# Patient Record
Sex: Male | Born: 1955 | Race: White | Hispanic: No | Smoking: Current every day smoker
Health system: Southern US, Community
[De-identification: ages and names within clinical notes are randomized; demographics above are authoritative.]

## PROBLEM LIST (undated history)

## (undated) DIAGNOSIS — M2540 Effusion, unspecified joint: Secondary | ICD-10-CM

## (undated) DIAGNOSIS — J302 Other seasonal allergic rhinitis: Secondary | ICD-10-CM

## (undated) DIAGNOSIS — M2550 Pain in unspecified joint: Secondary | ICD-10-CM

## (undated) HISTORY — PX: FINGER SURGERY: SHX640

## (undated) HISTORY — PX: FOOT SURGERY: SHX648

## (undated) HISTORY — PX: KNEE ARTHROSCOPY: SUR90

## (undated) HISTORY — DX: Other seasonal allergic rhinitis: J30.2

## (undated) HISTORY — PX: OTHER SURGICAL HISTORY: SHX169

---
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
## 1977-02-28 HISTORY — PX: PLANTAR'S WART EXCISION: SHX2240

## 1999-01-09 ENCOUNTER — Encounter: Payer: Self-pay | Admitting: Emergency Medicine

## 1999-01-09 ENCOUNTER — Emergency Department (HOSPITAL_COMMUNITY): Admission: EM | Admit: 1999-01-09 | Discharge: 1999-01-09 | Payer: Self-pay | Admitting: Emergency Medicine

## 1999-01-24 ENCOUNTER — Emergency Department (HOSPITAL_COMMUNITY): Admission: EM | Admit: 1999-01-24 | Discharge: 1999-01-24 | Payer: Self-pay | Admitting: Emergency Medicine

## 2002-03-08 ENCOUNTER — Encounter: Payer: Self-pay | Admitting: Family Medicine

## 2002-03-08 ENCOUNTER — Encounter: Admission: RE | Admit: 2002-03-08 | Discharge: 2002-03-08 | Payer: Self-pay | Admitting: Family Medicine

## 2010-02-28 HISTORY — PX: HEEL SPUR SURGERY: SHX665

## 2011-01-10 DIAGNOSIS — F17200 Nicotine dependence, unspecified, uncomplicated: Secondary | ICD-10-CM | POA: Insufficient documentation

## 2011-01-10 DIAGNOSIS — Y9289 Other specified places as the place of occurrence of the external cause: Secondary | ICD-10-CM | POA: Insufficient documentation

## 2011-01-10 DIAGNOSIS — M79609 Pain in unspecified limb: Secondary | ICD-10-CM | POA: Insufficient documentation

## 2011-01-10 DIAGNOSIS — M25476 Effusion, unspecified foot: Secondary | ICD-10-CM | POA: Insufficient documentation

## 2011-01-10 DIAGNOSIS — Y990 Civilian activity done for income or pay: Secondary | ICD-10-CM | POA: Insufficient documentation

## 2011-01-10 DIAGNOSIS — M25473 Effusion, unspecified ankle: Secondary | ICD-10-CM | POA: Insufficient documentation

## 2011-01-10 DIAGNOSIS — S92009A Unspecified fracture of unspecified calcaneus, initial encounter for closed fracture: Secondary | ICD-10-CM | POA: Insufficient documentation

## 2011-01-10 DIAGNOSIS — R296 Repeated falls: Secondary | ICD-10-CM | POA: Insufficient documentation

## 2011-01-11 ENCOUNTER — Emergency Department (HOSPITAL_COMMUNITY): Payer: Worker's Compensation

## 2011-01-11 ENCOUNTER — Emergency Department (HOSPITAL_COMMUNITY)
Admission: EM | Admit: 2011-01-11 | Discharge: 2011-01-11 | Disposition: A | Discharge status: Home / Self Care | Payer: Worker's Compensation | Attending: Emergency Medicine | Admitting: Emergency Medicine

## 2011-01-11 ENCOUNTER — Encounter: Payer: Self-pay | Admitting: Emergency Medicine

## 2011-01-11 DIAGNOSIS — S92002A Unspecified fracture of left calcaneus, initial encounter for closed fracture: Secondary | ICD-10-CM

## 2011-01-11 MED ORDER — OXYCODONE-ACETAMINOPHEN 5-325 MG PO TABS: 2.0000 | ORAL_TABLET | ORAL | Status: AC | PRN

## 2011-01-11 MED ORDER — OXYCODONE-ACETAMINOPHEN 5-325 MG PO TABS
2.0000 | ORAL_TABLET | Freq: Once | ORAL | Status: AC
  Administered 2011-01-11: 2 via ORAL
  Filled 2011-01-11 (×2): qty 1

## 2011-01-11 NOTE — ED Provider Notes (Signed)
History     CSN: [id number] Arrival date & time: 01/11/2011 12:07 AM   First MD Initiated Contact with Patient 01/11/11 [phone]      Chief Complaint  Patient presents with  . Foot Pain    (Consider location/radiation/quality/duration/timing/severity/associated sxs/prior treatment) HPI 55 year old gentleman presents from work after jumping from machinery about 5 feet and landing on his left foot. Patient unable to bear weight on his left foot due to pain. Pain in the heel and posterior part of ankle. No prior history of injury to this area. No other complaint of pain or injury  History reviewed. No pertinent past medical history.  History reviewed. No pertinent past surgical history.  No family history on file.  History  Substance Use Topics  . Smoking status: Current Everyday Smoker  . Smokeless tobacco: Not on file  . Alcohol Use: Yes     OCCASIONAL      Review of Systems  Constitutional: Negative.   HENT: Negative.   Eyes: Negative.   Respiratory: Negative.   Cardiovascular: Negative.   Gastrointestinal: Negative.   Genitourinary: Negative.   Musculoskeletal: Positive for arthralgias.  Skin: Negative.   Neurological: Negative.   Hematological: Negative.   Psychiatric/Behavioral: Negative.   All other systems reviewed and are negative.    Allergies  Review of patient's allergies indicates no known allergies.  Home Medications   Current Outpatient Rx  Name Route Sig Dispense Refill  . IBUPROFEN 200 MG PO TABS Oral Take 400 mg by mouth every 6 (six) hours as needed. For pain     . OXYCODONE-ACETAMINOPHEN 5-325 MG PO TABS Oral Take 2 tablets by mouth every 4 (four) hours as needed for pain. 20 tablet 0    BP 125/86  Pulse 88  Temp(Src) 98.8 F (37.1 C) (Oral)  Resp 14  SpO2 98%  Physical Exam  Nursing note and vitals reviewed. Constitutional: He is oriented to person, place, and time. He appears well-developed and well-nourished.  HENT:  Head:  Normocephalic and atraumatic.  Right Ear: External ear normal.  Left Ear: External ear normal.  Nose: Nose normal.  Mouth/Throat: Oropharynx is clear and moist.  Eyes: Conjunctivae and EOM are normal. Pupils are equal, round, and reactive to light.  Neck: Normal range of motion. Neck supple. No JVD present. No tracheal deviation present. No thyromegaly present.  Cardiovascular: Normal rate, regular rhythm, normal heart sounds and intact distal pulses.  Exam reveals no gallop and no friction rub.   No murmur heard. Pulmonary/Chest: Effort normal and breath sounds normal. No stridor. No respiratory distress. He has no wheezes. He has no rales. He exhibits no tenderness.  Abdominal: Soft. Bowel sounds are normal. He exhibits no distension and no mass. There is no tenderness. There is no rebound and no guarding.  Musculoskeletal: He exhibits tenderness. He exhibits no edema.       Pain with palpation of left heel, swelling of left ankle without crepitus of the malleolus. No pain with palpation of her malleolus. Pain with palpation of the Achilles tendon and medial ligaments. Ecchymosis noted over posterior aspect of ankle  Lymphadenopathy:    He has no cervical adenopathy.  Neurological: He is oriented to person, place, and time. He has normal reflexes. No cranial nerve deficit. He exhibits normal muscle tone. Coordination normal.  Skin: Skin is dry. No rash noted. No erythema. No pallor.  Psychiatric: He has a normal mood and affect. His behavior is normal. Judgment and thought content normal.    ED  Course  Procedures (including critical care time)  Labs Reviewed - No data to display Dg Ankle Complete Left  01/11/2011  *RADIOLOGY REPORT*  Clinical Data: Jumped off machine from about 5 feet above ground; left heel and ankle pain.  LEFT ANKLE COMPLETE - 3+ VIEW  Comparison: None.  Findings: There is an abnormal pattern of sclerosis within the calcaneus, raising concern for a poorly characterized  comminuted calcaneal fracture.  The appearance is compatible with a joint depression type fracture.  The ankle mortise is intact; the interosseous space is within normal limits.  No talar tilt or subluxation is seen.  No significant soft tissue abnormalities are characterized on radiograph.  IMPRESSION: Poorly characterized comminuted joint depression type fracture of the calcaneus noted; no additional fractures seen.  Original Report Authenticated By: Tonia Ghent, M.D.   Ct Ankle Left Wo Contrast  01/11/2011  *RADIOLOGY REPORT*  Clinical Data: Left foot pain after jumping 5 feet from machine. Calcaneal fracture noted on radiograph.  Assess calcaneal fracture.  CT OF THE LEFT ANKLE WITHOUT CONTRAST  Technique:  Multidetector CT imaging was performed according to the standard protocol. Multiplanar CT image reconstructions were also generated.  Comparison: Left ankle radiographs performed earlier today at 12:26 a.m.  Findings: There is a comminuted fracture involving the left calcaneus.  This reflects a Sanders type IIC fracture, with a medial intra-articular fracture line across the posterior facet.  Additional comminution is noted, with extra-articular fracture lines noted along the lateral aspect of the calcaneus. The medial intra-articular fracture demonstrates mild comminution at the medial aspect of the calcaneus.  In addition, minimally displaced fracture lines extend anteriorly across the calcaneus; it is difficult to determine whether one of these fracture lines extends to the anterior facet.  This reflects a joint depression type fracture, with the fracture line seen extending posteriorly and superiorly between the talus and Achilles.  The posterior aspect of the calcaneus remains intact.  A small joint effusion is noted at the ankle.  No additional fractures are seen.  The Achilles tendon remains intact.  The flexor and extensor tendons are unremarkable in appearance.  The peroneus tendons appear  intact.  Mild soft tissue swelling is noted about the ankle, relatively diffuse in appearance.  There is no evidence of entrapment of tendon structures, though the peroneus tendons run adjacent to the comminuted calcaneal fracture.  The vasculature is not well assessed.  IMPRESSION:  1.  Comminuted fracture involving the left calcaneus; this is a joint depression type fracture.  With respect to the posterior facet, this is a Sanders type IIC fracture, with a medial intra- articular fracture line seen.  Question of minimally displaced fracture line extending to the anterior facet. 2.  Small ankle joint effusion noted. 3.  Mild diffuse soft tissue swelling noted. 4.  The peroneus tendons run adjacent to the comminuted calcaneal fracture; no definite evidence of entrapment at this time.  Original Report Authenticated By: Tonia Ghent, M.D.     1. Calcaneus fracture, left       MDM  55 year old male with comminuted left calcaneus fracture. Discuss with Dr. Ave Filter who requests putting him in a bulky Jones dressing/splint, no weightbearing, pain control and crutches, and followup in one week        Olivia Mackie, MD 01/11/11 [phone]

## 2011-01-11 NOTE — Progress Notes (Signed)
Orthopedic Tech Progress Note Patient Details:  Bryan [name] [birth year] [id number]  Other Ortho Devices Type of Ortho Device: Crutches Ortho Device Location: left leg watson Lasandra Beech 01/11/2011, 8:54 AM

## 2011-01-11 NOTE — ED Notes (Signed)
Patient states he was at work and jumped off a machine approx. 5 ft. Landing directly on the heel of his left foot. Left ankle swollen, postive pedal pulse.

## 2011-01-11 NOTE — ED Notes (Signed)
PT. REPORTS LEFT FOOT PAIN ONSET THIS EVENING , STATES HE JUMPED FROM A MACHINE THIS EVENING AT WORK [facility] [employer]) . NO LOC , PAIN WORSE WITH MOVEMENT/ SWELLING.

## 2011-01-11 NOTE — Progress Notes (Signed)
Orthopedic Tech Progress Note Patient Details:  Bryan [name] 1955/03/23 [id number]  Other Ortho Devices Ortho Device Location: left leg watson jones   [name] 01/11/2011, 8:51 AM

## 2011-02-01 ENCOUNTER — Encounter (HOSPITAL_COMMUNITY): Payer: Self-pay | Admitting: Pharmacy Technician

## 2011-02-04 ENCOUNTER — Encounter (HOSPITAL_COMMUNITY): Payer: Self-pay

## 2011-02-04 ENCOUNTER — Encounter (HOSPITAL_COMMUNITY)
Admission: RE | Admit: 2011-02-04 | Discharge: 2011-02-04 | Disposition: A | Discharge status: Home / Self Care | Payer: Worker's Compensation | Source: Ambulatory Visit | Attending: Orthopedic Surgery | Admitting: Orthopedic Surgery

## 2011-02-04 ENCOUNTER — Other Ambulatory Visit: Payer: Self-pay

## 2011-02-04 HISTORY — DX: Pain in unspecified joint: M25.50

## 2011-02-04 HISTORY — DX: Effusion, unspecified joint: M25.40

## 2011-02-04 LAB — ABO/RH: ABO/RH(D): O POS

## 2011-02-04 LAB — CBC
Hemoglobin: 14.7 g/dL (ref 13.0–17.0)
MCHC: 34.9 g/dL (ref 30.0–36.0)
Platelets: 154 10*3/uL (ref 150–400)
RDW: 12.8 % (ref 11.5–15.5)

## 2011-02-04 LAB — URINALYSIS, ROUTINE W REFLEX MICROSCOPIC
Bilirubin Urine: NEGATIVE
Ketones, ur: 15 mg/dL — AB
Leukocytes, UA: NEGATIVE
Nitrite: NEGATIVE
Specific Gravity, Urine: 1.024 (ref 1.005–1.030)
Urobilinogen, UA: 1 mg/dL (ref 0.0–1.0)
pH: 5 (ref 5.0–8.0)

## 2011-02-04 LAB — DIFFERENTIAL
Eosinophils Absolute: 0.1 10*3/uL (ref 0.0–0.7)
Eosinophils Relative: 1 % (ref 0–5)
Lymphocytes Relative: 34 % (ref 12–46)
Lymphs Abs: 1.7 10*3/uL (ref 0.7–4.0)
Monocytes Relative: 12 % (ref 3–12)

## 2011-02-04 LAB — COMPREHENSIVE METABOLIC PANEL
ALT: 16 U/L (ref 0–53)
AST: 23 U/L (ref 0–37)
Albumin: 3.6 g/dL (ref 3.5–5.2)
Alkaline Phosphatase: 57 U/L (ref 39–117)
Calcium: 9.1 mg/dL (ref 8.4–10.5)
Potassium: 4.1 mEq/L (ref 3.5–5.1)
Sodium: 138 mEq/L (ref 135–145)
Total Protein: 6.7 g/dL (ref 6.0–8.3)

## 2011-02-04 LAB — APTT: aPTT: 38 seconds — ABNORMAL HIGH (ref 24–37)

## 2011-02-04 LAB — TYPE AND SCREEN: Antibody Screen: NEGATIVE

## 2011-02-04 NOTE — Pre-Procedure Instructions (Signed)
Bryan [name]  02/04/2011   Your procedure is scheduled on:  Tues,Dec 11 @ 1000  Report to [facility] at 0800 AM.  Call this number if you have problems the morning of surgery: [phone]   Remember:   Do not eat food:After Midnight.  May have clear liquids: up to 4 Hours before arrival.(until 4am)  Clear liquids include soda, tea, black coffee, apple or grape juice, broth.  Take these medicines the morning of surgery with A SIP OF WATER:    Do not wear jewelry, make-up or nail polish.  Do not wear lotions, powders, or perfumes. You may wear deodorant.  Do not shave 48 hours prior to surgery.  Do not bring valuables to the hospital.  Contacts, dentures or bridgework may not be worn into surgery.  Leave suitcase in the car. After surgery it may be brought to your room.  For patients admitted to the hospital, checkout time is 11:00 AM the day of discharge.   Patients discharged the day of surgery will not be allowed to drive home.  Name and phone number of your driver:   Special Instructions: CHG Shower Use Special Wash: 1/2 bottle night before surgery and 1/2 bottle morning of surgery.   Please read over the following fact sheets that you were given: Pain Booklet, Coughing and Deep Breathing, MRSA Information and Surgical Site Infection Prevention

## 2011-02-06 LAB — URINE CULTURE: Culture  Setup Time: 201212071134

## 2011-02-07 MED ORDER — CEFAZOLIN SODIUM 1-5 GM-% IV SOLN
1.0000 g | INTRAVENOUS | Status: AC
  Administered 2011-02-08: 1 g via INTRAVENOUS
  Filled 2011-02-07: qty 50

## 2011-02-07 NOTE — H&P (Signed)
Orthopaedic Trauma Service   Chief Complaint: L calcaneus Fracture HPI:  55 y/o caucasian male who fell at work on 01/10/2011 sustaining a complex L intra-articular calcaneus fracture.  Secondary to swelling pt could not be operated on acutely. We have been following the pt closely, watching his soft tissue carefully.  Pt has also smoked for many years and we discussed many options with him, stating that should he elect to have surgery he needed to quit smoking secondary to the surgical approach being a watershed area for blood flow.  The pt has done very well this far decreasing his nicotine intake and is now ready for ORIF of his L calcaneus fracture  Past Medical History  Diagnosis Date  . Joint pain     left heel  . Joint swelling     calcaneus fracture  . Curvature of spine     Past Surgical History  Procedure Date  . Foot surgery 30+yrs ago    left   . Finger surgery 30+yrs ago    right pointer finger  . Knee arthroscopy at age 66    right     Family History  Problem Relation Age of Onset  . Anesthesia problems Neg Hx   . Hypotension Neg Hx   . Malignant hyperthermia Neg Hx   . Pseudochol deficiency Neg Hx    Social History:  reports that he has been smoking Cigarettes.  He has a 35 pack-year smoking history. He does not have any smokeless tobacco history on file. He reports that he drinks alcohol. He reports that he does not use illicit drugs. Per pt report he has since decreases smoking intake dramatically  Allergies: No Known Allergies  No current facility-administered medications on file as of .   Medications Prior to Admission  Medication Sig Dispense Refill  . ibuprofen (ADVIL,MOTRIN) 200 MG tablet Take 400 mg by mouth every 6 (six) hours as needed. For pain        No results found for this or any previous visit (from the past 48 hour(s)). No results found.  ROS: no cp, sob, n/v/d  No recent illnesses   PE  Office vitals are stable  Gen:  NAD Lungs:  Decreased at bases o/w clear Cardiac: s1 and s2 Abd: + BS L LEX  Decreased swelling  Motor and sensory intact  Extremity is warm  + DP pulse     Assessment/Plan  55 y/o male s/p fall with L calcaneus fx  OR for ORIF L calcaneus Admit after surgery for pain control and observation Limit nicotine intake Pt understands full well the ramifications of continued nicotine intake including but not limited to infection, potentially leading to amputation, nonunion, further surgery, etc. Pt wants to proceed   Mearl Latin, PA-C 02/07/2011, 2:32 PM

## 2011-02-08 ENCOUNTER — Encounter (HOSPITAL_COMMUNITY): Payer: Self-pay | Admitting: *Deleted

## 2011-02-08 ENCOUNTER — Inpatient Hospital Stay (HOSPITAL_COMMUNITY): Payer: Worker's Compensation | Admitting: Anesthesiology

## 2011-02-08 ENCOUNTER — Inpatient Hospital Stay (HOSPITAL_COMMUNITY): Payer: Worker's Compensation

## 2011-02-08 ENCOUNTER — Encounter (HOSPITAL_COMMUNITY): Payer: Self-pay | Admitting: Anesthesiology

## 2011-02-08 ENCOUNTER — Ambulatory Visit (HOSPITAL_COMMUNITY)
Admission: RE | Admit: 2011-02-08 | Discharge: 2011-02-10 | Disposition: A | Discharge status: Home / Self Care | Payer: Worker's Compensation | Source: Ambulatory Visit | Attending: Orthopedic Surgery | Admitting: Orthopedic Surgery

## 2011-02-08 ENCOUNTER — Encounter (HOSPITAL_COMMUNITY)
Admission: RE | Disposition: A | Discharge status: Home / Self Care | Payer: Self-pay | Source: Ambulatory Visit | Attending: Orthopedic Surgery

## 2011-02-08 DIAGNOSIS — Z01810 Encounter for preprocedural cardiovascular examination: Secondary | ICD-10-CM | POA: Insufficient documentation

## 2011-02-08 DIAGNOSIS — X58XXXA Exposure to other specified factors, initial encounter: Secondary | ICD-10-CM | POA: Insufficient documentation

## 2011-02-08 DIAGNOSIS — S92002A Unspecified fracture of left calcaneus, initial encounter for closed fracture: Secondary | ICD-10-CM

## 2011-02-08 DIAGNOSIS — F17200 Nicotine dependence, unspecified, uncomplicated: Secondary | ICD-10-CM

## 2011-02-08 DIAGNOSIS — S92009A Unspecified fracture of unspecified calcaneus, initial encounter for closed fracture: Principal | ICD-10-CM | POA: Insufficient documentation

## 2011-02-08 DIAGNOSIS — Z01818 Encounter for other preprocedural examination: Secondary | ICD-10-CM | POA: Insufficient documentation

## 2011-02-08 DIAGNOSIS — F1020 Alcohol dependence, uncomplicated: Secondary | ICD-10-CM

## 2011-02-08 DIAGNOSIS — Z01812 Encounter for preprocedural laboratory examination: Secondary | ICD-10-CM | POA: Insufficient documentation

## 2011-02-08 HISTORY — PX: ORIF CALCANEOUS FRACTURE: SHX5030

## 2011-02-08 SURGERY — OPEN REDUCTION INTERNAL FIXATION (ORIF) CALCANEOUS FRACTURE
Anesthesia: General | Site: Ankle | Laterality: Left | Wound class: Clean

## 2011-02-08 MED ORDER — MAGNESIUM HYDROXIDE 400 MG/5ML PO SUSP: 30.0000 mL | Freq: Every day | ORAL | Status: DC | PRN

## 2011-02-08 MED ORDER — ONDANSETRON HCL 4 MG/2ML IJ SOLN: 4.0000 mg | Freq: Four times a day (QID) | INTRAMUSCULAR | Status: DC | PRN

## 2011-02-08 MED ORDER — METHOCARBAMOL 100 MG/ML IJ SOLN: 500.0000 mg | Freq: Four times a day (QID) | INTRAVENOUS | Status: DC | PRN

## 2011-02-08 MED ORDER — METOCLOPRAMIDE HCL 10 MG PO TABS: 5.0000 mg | ORAL_TABLET | Freq: Three times a day (TID) | ORAL | Status: DC | PRN

## 2011-02-08 MED ORDER — POTASSIUM CHLORIDE IN NACL 20-0.9 MEQ/L-% IV SOLN
INTRAVENOUS | Status: DC
  Filled 2011-02-08 (×4): qty 1000

## 2011-02-08 MED ORDER — PREGABALIN 75 MG PO CAPS
75.0000 mg | ORAL_CAPSULE | Freq: Two times a day (BID) | ORAL | Status: DC
  Administered 2011-02-08 – 2011-02-10 (×4): 75 mg via ORAL
  Filled 2011-02-08 (×4): qty 1

## 2011-02-08 MED ORDER — SODIUM CHLORIDE 0.9 % IR SOLN
Status: DC | PRN
  Administered 2011-02-08: 1000 mL

## 2011-02-08 MED ORDER — GLYCOPYRROLATE 0.2 MG/ML IJ SOLN
INTRAMUSCULAR | Status: DC | PRN
  Administered 2011-02-08: .4 mg via INTRAVENOUS

## 2011-02-08 MED ORDER — DIPHENHYDRAMINE HCL 12.5 MG/5ML PO ELIX
12.5000 mg | ORAL_SOLUTION | Freq: Four times a day (QID) | ORAL | Status: DC | PRN
  Filled 2011-02-08: qty 5

## 2011-02-08 MED ORDER — HYDROMORPHONE HCL PF 1 MG/ML IJ SOLN: 0.2500 mg | INTRAMUSCULAR | Status: DC | PRN

## 2011-02-08 MED ORDER — SPIRITUS FRUMENTI
1.0000 | Freq: Every day | ORAL | Status: DC
  Administered 2011-02-08: 1 via ORAL
  Filled 2011-02-08 (×4): qty 1

## 2011-02-08 MED ORDER — DOCUSATE SODIUM 100 MG PO CAPS
100.0000 mg | ORAL_CAPSULE | Freq: Two times a day (BID) | ORAL | Status: DC
  Administered 2011-02-08 – 2011-02-10 (×4): 100 mg via ORAL
  Filled 2011-02-08 (×5): qty 1

## 2011-02-08 MED ORDER — METOCLOPRAMIDE HCL 5 MG/ML IJ SOLN
5.0000 mg | Freq: Three times a day (TID) | INTRAMUSCULAR | Status: DC | PRN
  Administered 2011-02-08: 10 mg via INTRAVENOUS
  Filled 2011-02-08: qty 2

## 2011-02-08 MED ORDER — MAGNESIUM CITRATE PO SOLN: 1.0000 | Freq: Once | ORAL | Status: AC | PRN

## 2011-02-08 MED ORDER — METHOCARBAMOL 500 MG PO TABS: 500.0000 mg | ORAL_TABLET | Freq: Four times a day (QID) | ORAL | Status: DC | PRN

## 2011-02-08 MED ORDER — OXYCODONE-ACETAMINOPHEN 5-325 MG PO TABS
1.0000 | ORAL_TABLET | Freq: Four times a day (QID) | ORAL | Status: DC | PRN
  Administered 2011-02-09 – 2011-02-10 (×4): 2 via ORAL
  Filled 2011-02-08 (×3): qty 2
  Filled 2011-02-08: qty 216

## 2011-02-08 MED ORDER — SODIUM CHLORIDE 0.9 % IJ SOLN: 9.0000 mL | INTRAMUSCULAR | Status: DC | PRN

## 2011-02-08 MED ORDER — PROPOFOL 10 MG/ML IV EMUL: INTRAVENOUS | Status: DC | PRN

## 2011-02-08 MED ORDER — FENTANYL CITRATE 0.05 MG/ML IJ SOLN
INTRAMUSCULAR | Status: AC
  Filled 2011-02-08: qty 2

## 2011-02-08 MED ORDER — NALOXONE HCL 0.4 MG/ML IJ SOLN: 0.4000 mg | INTRAMUSCULAR | Status: DC | PRN

## 2011-02-08 MED ORDER — MORPHINE SULFATE (PF) 1 MG/ML IV SOLN
INTRAVENOUS | Status: DC
  Administered 2011-02-08: 1.5 mg via INTRAVENOUS
  Filled 2011-02-08: qty 25

## 2011-02-08 MED ORDER — TEMAZEPAM 15 MG PO CAPS: 15.0000 mg | ORAL_CAPSULE | Freq: Every evening | ORAL | Status: DC | PRN

## 2011-02-08 MED ORDER — DIPHENHYDRAMINE HCL 12.5 MG/5ML PO ELIX
12.5000 mg | ORAL_SOLUTION | ORAL | Status: DC | PRN
  Filled 2011-02-08: qty 10

## 2011-02-08 MED ORDER — MORPHINE SULFATE 2 MG/ML IJ SOLN: 1.0000 mg | INTRAMUSCULAR | Status: DC | PRN

## 2011-02-08 MED ORDER — PROPOFOL 10 MG/ML IV BOLUS
INTRAVENOUS | Status: DC | PRN
  Administered 2011-02-08: 150 mg via INTRAVENOUS

## 2011-02-08 MED ORDER — DIPHENHYDRAMINE HCL 50 MG/ML IJ SOLN: 12.5000 mg | Freq: Four times a day (QID) | INTRAMUSCULAR | Status: DC | PRN

## 2011-02-08 MED ORDER — MIDAZOLAM HCL 2 MG/2ML IJ SOLN
INTRAMUSCULAR | Status: AC
  Filled 2011-02-08: qty 2

## 2011-02-08 MED ORDER — CEFAZOLIN SODIUM 1-5 GM-% IV SOLN
1.0000 g | Freq: Four times a day (QID) | INTRAVENOUS | Status: AC
  Administered 2011-02-08 – 2011-02-09 (×3): 1 g via INTRAVENOUS
  Filled 2011-02-08 (×3): qty 50

## 2011-02-08 MED ORDER — LACTATED RINGERS IV SOLN
INTRAVENOUS | Status: DC
  Administered 2011-02-08 (×2): via INTRAVENOUS

## 2011-02-08 MED ORDER — OXYCODONE HCL 5 MG PO TABS: 5.0000 mg | ORAL_TABLET | ORAL | Status: DC | PRN

## 2011-02-08 MED ORDER — CHLORHEXIDINE GLUCONATE 4 % EX LIQD: 60.0000 mL | Freq: Once | CUTANEOUS | Status: DC

## 2011-02-08 MED ORDER — ROCURONIUM BROMIDE 100 MG/10ML IV SOLN
INTRAVENOUS | Status: DC | PRN
  Administered 2011-02-08: 50 mg via INTRAVENOUS

## 2011-02-08 MED ORDER — PHENYLEPHRINE HCL 10 MG/ML IJ SOLN
INTRAMUSCULAR | Status: DC | PRN
  Administered 2011-02-08: 40 ug via INTRAVENOUS
  Administered 2011-02-08 (×3): 80 ug via INTRAVENOUS
  Administered 2011-02-08 (×3): 40 ug via INTRAVENOUS

## 2011-02-08 MED ORDER — BUPIVACAINE-EPINEPHRINE PF 0.5-1:200000 % IJ SOLN
INTRAMUSCULAR | Status: DC | PRN
  Administered 2011-02-08: 30 mL
  Administered 2011-02-08: 7 mL

## 2011-02-08 MED ORDER — PROMETHAZINE HCL 25 MG/ML IJ SOLN: 6.2500 mg | INTRAMUSCULAR | Status: DC | PRN

## 2011-02-08 MED ORDER — ENOXAPARIN SODIUM 40 MG/0.4ML ~~LOC~~ SOLN
40.0000 mg | SUBCUTANEOUS | Status: DC
  Administered 2011-02-09 – 2011-02-10 (×2): 40 mg via SUBCUTANEOUS
  Filled 2011-02-08 (×3): qty 0.4

## 2011-02-08 MED ORDER — MIDAZOLAM HCL 2 MG/2ML IJ SOLN
1.0000 mg | INTRAMUSCULAR | Status: AC | PRN
  Administered 2011-02-08 (×2): 2 mg via INTRAVENOUS

## 2011-02-08 MED ORDER — BISACODYL 10 MG RE SUPP: 10.0000 mg | Freq: Every day | RECTAL | Status: DC | PRN

## 2011-02-08 MED ORDER — FENTANYL CITRATE 0.05 MG/ML IJ SOLN
50.0000 ug | INTRAMUSCULAR | Status: AC | PRN
  Administered 2011-02-08: 250 ug via INTRAVENOUS
  Administered 2011-02-08: 100 ug via INTRAVENOUS

## 2011-02-08 MED ORDER — ONDANSETRON HCL 4 MG PO TABS: 4.0000 mg | ORAL_TABLET | Freq: Four times a day (QID) | ORAL | Status: DC | PRN

## 2011-02-08 MED ORDER — NEOSTIGMINE METHYLSULFATE 1 MG/ML IJ SOLN
INTRAMUSCULAR | Status: DC | PRN
  Administered 2011-02-08: 3 mg via INTRAVENOUS

## 2011-02-08 MED ORDER — ONDANSETRON HCL 4 MG/2ML IJ SOLN
INTRAMUSCULAR | Status: DC | PRN
  Administered 2011-02-08: 4 mg via INTRAVENOUS

## 2011-02-08 SURGICAL SUPPLY — 67 items
BANDAGE ACE 4 STERILE (GAUZE/BANDAGES/DRESSINGS) ×2 IMPLANT
BANDAGE ELASTIC 4 VELCRO ST LF (GAUZE/BANDAGES/DRESSINGS) ×2 IMPLANT
BANDAGE ELASTIC 6 VELCRO ST LF (GAUZE/BANDAGES/DRESSINGS) ×2 IMPLANT
BANDAGE ESMARK 6X9 LF (GAUZE/BANDAGES/DRESSINGS) ×1 IMPLANT
BANDAGE GAUZE ELAST BULKY 4 IN (GAUZE/BANDAGES/DRESSINGS) ×4 IMPLANT
BIT DRILL 2.5X2.75 QC CALB (BIT) ×2 IMPLANT
BLADE SURG 10 STRL SS (BLADE) ×2 IMPLANT
BNDG COHESIVE 4X5 TAN STRL (GAUZE/BANDAGES/DRESSINGS) ×2 IMPLANT
BNDG ESMARK 6X9 LF (GAUZE/BANDAGES/DRESSINGS) ×2
BONE CHIP PRESERV 20CC (Bone Implant) ×2 IMPLANT
BRUSH SCRUB DISP (MISCELLANEOUS) ×4 IMPLANT
CLOTH BEACON ORANGE TIMEOUT ST (SAFETY) ×2 IMPLANT
COVER MAYO STAND STRL (DRAPES) ×2 IMPLANT
COVER SURGICAL LIGHT HANDLE (MISCELLANEOUS) ×4 IMPLANT
DRAIN TLS ROUND 10FR (DRAIN) ×2 IMPLANT
DRAPE C-ARM 42X72 X-RAY (DRAPES) ×2 IMPLANT
DRAPE C-ARMOR (DRAPES) ×2 IMPLANT
DRAPE ORTHO SPLIT 77X108 STRL (DRAPES) ×1
DRAPE SURG ORHT 6 SPLT 77X108 (DRAPES) ×1 IMPLANT
DRAPE U-SHAPE 47X51 STRL (DRAPES) ×2 IMPLANT
DRSG EMULSION OIL 3X3 NADH (GAUZE/BANDAGES/DRESSINGS) ×2 IMPLANT
ELECT REM PT RETURN 9FT ADLT (ELECTROSURGICAL) ×2
ELECTRODE REM PT RTRN 9FT ADLT (ELECTROSURGICAL) ×1 IMPLANT
GAUZE SPONGE 4X4 12PLY STRL LF (GAUZE/BANDAGES/DRESSINGS) ×2 IMPLANT
GLOVE BIO SURGEON STRL SZ7.5 (GLOVE) ×2 IMPLANT
GLOVE BIO SURGEON STRL SZ8 (GLOVE) ×2 IMPLANT
GLOVE BIOGEL PI IND STRL 7.5 (GLOVE) ×1 IMPLANT
GLOVE BIOGEL PI INDICATOR 7.5 (GLOVE) ×1
GOWN PREVENTION PLUS XLARGE (GOWN DISPOSABLE) ×2 IMPLANT
GOWN STRL NON-REIN LRG LVL3 (GOWN DISPOSABLE) ×4 IMPLANT
K-WIRE ACE 1.6X6 (WIRE) ×6
KIT BASIN OR (CUSTOM PROCEDURE TRAY) ×2 IMPLANT
KIT ROOM TURNOVER OR (KITS) ×2 IMPLANT
KWIRE ACE 1.6X6 (WIRE) ×3 IMPLANT
MANIFOLD NEPTUNE II (INSTRUMENTS) ×2 IMPLANT
NEEDLE HYPO 21X1.5 SAFETY (NEEDLE) IMPLANT
NS IRRIG 1000ML POUR BTL (IV SOLUTION) ×2 IMPLANT
PACK ORTHO EXTREMITY (CUSTOM PROCEDURE TRAY) ×2 IMPLANT
PAD ARMBOARD 7.5X6 YLW CONV (MISCELLANEOUS) ×4 IMPLANT
PAD CAST 4YDX4 CTTN HI CHSV (CAST SUPPLIES) ×2 IMPLANT
PADDING CAST COTTON 4X4 STRL (CAST SUPPLIES) ×2
PADDING CAST COTTON 6X4 STRL (CAST SUPPLIES) ×2 IMPLANT
PADDING WEBRIL 4 STERILE (GAUZE/BANDAGES/DRESSINGS) ×2 IMPLANT
PADDING WEBRIL 6 STERILE (GAUZE/BANDAGES/DRESSINGS) ×2 IMPLANT
PENCIL BUTTON HOLSTER BLD 10FT (ELECTRODE) ×2 IMPLANT
PLATE ACE PERIMETER LRG (Plate) ×2 IMPLANT
SCREW CORTICAL 3.5MM  34MM (Screw) ×4 IMPLANT
SCREW CORTICAL 3.5MM 26MM (Screw) ×2 IMPLANT
SCREW CORTICAL 3.5MM 34MM (Screw) ×4 IMPLANT
SCREW CORTICAL 3.5MM 36MM (Screw) ×2 IMPLANT
SCREW CORTICAL 3.5MM 40MM (Screw) ×2 IMPLANT
SPLINT PLASTER CAST XFAST 5X30 (CAST SUPPLIES) ×1 IMPLANT
SPLINT PLASTER XFAST SET 5X30 (CAST SUPPLIES) ×1
SPONGE GAUZE 4X4 12PLY (GAUZE/BANDAGES/DRESSINGS) ×2 IMPLANT
SPONGE LAP 18X18 X RAY DECT (DISPOSABLE) ×6 IMPLANT
SPONGE SCRUB IODOPHOR (GAUZE/BANDAGES/DRESSINGS) ×2 IMPLANT
SUCTION FRAZIER TIP 10 FR DISP (SUCTIONS) ×2 IMPLANT
SUT ETHILON 3 0 PS 1 (SUTURE) ×4 IMPLANT
SUT VIC AB 2-0 CT3 27 (SUTURE) IMPLANT
SUT VIC AB 2-0 SH 18 (SUTURE) ×2 IMPLANT
SUT VIC AB 3-0 FS2 27 (SUTURE) ×2 IMPLANT
SYR CONTROL 10ML LL (SYRINGE) IMPLANT
TOWEL OR 17X24 6PK STRL BLUE (TOWEL DISPOSABLE) ×2 IMPLANT
TOWEL OR 17X26 10 PK STRL BLUE (TOWEL DISPOSABLE) ×4 IMPLANT
TUBE CONNECTING 12X1/4 (SUCTIONS) ×2 IMPLANT
UNDERPAD 30X30 INCONTINENT (UNDERPADS AND DIAPERS) ×2 IMPLANT
WATER STERILE IRR 1000ML POUR (IV SOLUTION) ×2 IMPLANT

## 2011-02-08 NOTE — Brief Op Note (Signed)
02/08/2011  2:52 PM  PATIENT:  Bryan [name]  55 y.o. male  PRE-OPERATIVE DIAGNOSIS:  FRACTURED LEFT CALCANEUS  POST-OPERATIVE DIAGNOSIS:  FRACTURED LEFT CALCANEUS  PROCEDURE:  Procedure(s): OPEN REDUCTION INTERNAL FIXATION (ORIF) CALCANEOUS FRACTURE  SURGEON:  Surgeon(s): [employer]  PHYSICIAN ASSISTANT: Montez Morita, PAC  ANESTHESIA:   regional, general  EBL:  Total I/O In: 1700 [I.V.:1700] Out: 50 [Blood:50]  BLOOD ADMINISTERED:none  DRAINS: (one) Jackson-Pratt drain(s) with closed bulb suction in the foot   LOCAL MEDICATIONS USED:  NONE  SPECIMEN:  No Specimen  DISPOSITION OF SPECIMEN:  N/A  COUNTS:  YES  TOURNIQUET:   Total Tourniquet Time Documented: Thigh (Left) - 71 minutes  DICTATION: .Other Dictation: Dictation Number [id number]  PLAN OF CARE: Admit to inpatient   PATIENT DISPOSITION:  PACU - hemodynamically stable.   Delay start of Pharmacological VTE agent (>24hrs) due to surgical blood loss or risk of bleeding:  NO

## 2011-02-08 NOTE — Transfer of Care (Signed)
Immediate Anesthesia Transfer of Care Note  Patient: Bryan [name]  Procedure(s) Performed:  OPEN REDUCTION INTERNAL FIXATION (ORIF) CALCANEOUS FRACTURE - Left Ankle.  Patient Location: PACU  Anesthesia Type: GA combined with regional for post-op pain  Level of Consciousness: awake, alert , oriented and patient cooperative  Airway & Oxygen Therapy: Patient Spontanous Breathing and Patient connected to nasal cannula oxygen  Post-op Assessment: Report given to PACU RN, Post -op Vital signs reviewed and stable and Patient moving all extremities X 4  Post vital signs: Reviewed and stable  Complications: No apparent anesthesia complications

## 2011-02-08 NOTE — Anesthesia Postprocedure Evaluation (Signed)
  Anesthesia Post-op Note  Patient: Bryan [name]  Procedure(s) Performed:  OPEN REDUCTION INTERNAL FIXATION (ORIF) CALCANEOUS FRACTURE - Left Ankle.  Patient Location: PACU  Anesthesia Type: GA combined with regional for post-op pain  Level of Consciousness: awake, alert  and oriented  Airway and Oxygen Therapy: Patient Spontanous Breathing  Post-op Pain: none  Post-op Assessment: Post-op Vital signs reviewed, Patient's Cardiovascular Status Stable, Respiratory Function Stable, Patent Airway, No signs of Nausea or vomiting and Pain level controlled  Post-op Vital Signs: Reviewed and stable  Complications: No apparent anesthesia complications

## 2011-02-08 NOTE — Anesthesia Preprocedure Evaluation (Addendum)
Anesthesia Evaluation  Patient identified by MRN, date of birth, ID band Patient awake    Reviewed: Allergy & Precautions, H&P , NPO status , Patient's Chart, lab work & pertinent test results  Airway Mallampati: II TM Distance: >3 FB Neck ROM: Full    Dental No notable dental hx. (+) Teeth Intact   Pulmonary Current Smoker,  clear to auscultation  Pulmonary exam normal       Cardiovascular neg cardio ROS Regular Normal    Neuro/Psych    GI/Hepatic negative GI ROS, Neg liver ROS,   Endo/Other  Negative Endocrine ROS  Renal/GU negative Renal ROS     Musculoskeletal negative musculoskeletal ROS (+)   Abdominal   Peds  Hematology   Anesthesia Other Findings   Reproductive/Obstetrics                           Anesthesia Physical Anesthesia Plan  ASA: II  Anesthesia Plan: General   Post-op Pain Management:    Induction: Intravenous  Airway Management Planned: Oral ETT  Additional Equipment:   Intra-op Plan:   Post-operative Plan: Extubation in OR  Informed Consent: I have reviewed the patients History and Physical, chart, labs and discussed the procedure including the risks, benefits and alternatives for the proposed anesthesia with the patient or authorized representative who has indicated his/her understanding and acceptance.   Dental advisory given  Plan Discussed with: CRNA and Surgeon  Anesthesia Plan Comments: (Plan routine monitors, GETA with popliteal block for post op analgesia)        Anesthesia Quick Evaluation

## 2011-02-08 NOTE — Anesthesia Procedure Notes (Addendum)
Anesthesia Regional Block:  Popliteal block  Pre-Anesthetic Checklist: ,, timeout performed, Correct Patient, Correct Site, Correct Laterality, Correct Procedure, Correct Position, site marked, Risks and benefits discussed,  Surgical consent,  Pre-op evaluation,  At surgeon's request and post-op pain management  Laterality: Left  Prep: chloraprep       Needles:  Injection technique: Single-shot  Needle Type: Stimulator Needle - 80      Needle Gauge: 22 and 22 G    Additional Needles:  Procedures: nerve stimulator Popliteal block  Nerve Stimulator or Paresthesia:  Response: toe dorsiflexion, toe abduction, 0.5 mA, 1 ms,   Additional Responses:   Narrative:  Start time: 02/08/2011 10:44 AM End time: 02/08/2011 10:59 AM Injection made incrementally with aspirations every 5 mL.  Performed by: Personally  Anesthesiologist: Sandford Craze, MD  Additional Notes: Pt identified in Holding room.  Monitors applied. Working IV access confirmed. Sterile prep.  #22ga PNS to toe dorsiflexion and toe abduction twitcesh at 0.55mA threshold.  30cc 0.5% Bupivacaine with 1:200k epi injected incrementally after negative test dose.  Patient asymptomatic, VSS, no heme aspirated, tolerated well.   Reprep L prox, ant, medial tibia.  7cc 0.5% Bupiv with epi 1:200k injected subcutaneously for saphenous nerve supplementation.  Pt tol well.  VSS   Procedure Name: Intubation Performed by: Isidore Moos A Preoxygenation: Pre-oxygenation with 100% oxygen Intubation Type: IV induction Ventilation: Mask ventilation without difficulty Laryngoscope Size: Miller and 3 Grade View: Grade II Tube type: Oral Tube size: 7.5 mm Placement Confirmation: ETT inserted through vocal cords under direct vision,  positive ETCO2 and breath sounds checked- equal and bilateral Secured at: 23 cm Tube secured with: Tape Dental Injury: Teeth and Oropharynx as per pre-operative assessment

## 2011-02-08 NOTE — H&P (Signed)
I have seen and examined the patient. I agree with the findings above. I discussed with the patient the risks and benefits of surgery, including the possibility of infection, nerve injury, vessel injury, wound breakdown, arthritis, symptomatic hardware, DVT/ PE, loss of motion, and need for further surgery among others.  We also specifically discussed the elevated risk of soft tissue breakdown that could lead to amputation.  He understands these risks and wishes to proceed.  Bryan [name] 02/08/2011 11:01 AM

## 2011-02-08 NOTE — Preoperative (Signed)
Beta Blockers   Reason not to administer Beta Blockers:Not Applicable 

## 2011-02-09 ENCOUNTER — Encounter (HOSPITAL_COMMUNITY): Payer: Self-pay | Admitting: Orthopedic Surgery

## 2011-02-09 DIAGNOSIS — F1020 Alcohol dependence, uncomplicated: Secondary | ICD-10-CM

## 2011-02-09 DIAGNOSIS — S92002A Unspecified fracture of left calcaneus, initial encounter for closed fracture: Secondary | ICD-10-CM

## 2011-02-09 DIAGNOSIS — F17200 Nicotine dependence, unspecified, uncomplicated: Secondary | ICD-10-CM

## 2011-02-09 MED ORDER — DIPHENHYDRAMINE HCL 12.5 MG/5ML PO ELIX
12.5000 mg | ORAL_SOLUTION | Freq: Four times a day (QID) | ORAL | Status: DC | PRN
  Filled 2011-02-09: qty 5

## 2011-02-09 MED ORDER — NALOXONE HCL 0.4 MG/ML IJ SOLN: 0.4000 mg | INTRAMUSCULAR | Status: DC | PRN

## 2011-02-09 MED ORDER — DIPHENHYDRAMINE HCL 50 MG/ML IJ SOLN: 12.5000 mg | Freq: Four times a day (QID) | INTRAMUSCULAR | Status: DC | PRN

## 2011-02-09 MED ORDER — MORPHINE SULFATE (PF) 1 MG/ML IV SOLN: INTRAVENOUS | Status: AC

## 2011-02-09 MED ORDER — SODIUM CHLORIDE 0.9 % IJ SOLN: 9.0000 mL | INTRAMUSCULAR | Status: DC | PRN

## 2011-02-09 MED ORDER — ONDANSETRON HCL 4 MG/2ML IJ SOLN: 4.0000 mg | Freq: Four times a day (QID) | INTRAMUSCULAR | Status: DC | PRN

## 2011-02-09 MED ORDER — MORPHINE SULFATE (PF) 1 MG/ML IV SOLN: INTRAVENOUS | Status: DC

## 2011-02-09 NOTE — Progress Notes (Signed)
Physical Therapy Evaluation Patient Details Name: Bryan [name] MRN: [id number] DOB: [birth year] Today's Date: 02/09/2011  Problem List:  Patient Active Problem List  Diagnoses  . Calcaneus fracture, left  . Nicotine dependence  . Alcohol dependence    Past Medical History:  Past Medical History  Diagnosis Date  . Joint pain     left heel  . Joint swelling     calcaneus fracture  . Curvature of spine    Past Surgical History:  Past Surgical History  Procedure Date  . Foot surgery 30+yrs ago    left   . Finger surgery 30+yrs ago    right pointer finger  . Knee arthroscopy at age 89    right   . Orif calcaneous fracture 02/08/2011    Procedure: OPEN REDUCTION INTERNAL FIXATION (ORIF) CALCANEOUS FRACTURE;  Surgeon: Budd Palmer;  Location: [facility] OR;  Service: Orthopedics;  Laterality: Left;  Left Ankle.    PT Assessment/Plan/Recommendation PT Assessment Clinical Impression Statement: Pt is 55yo male s/p ORIF left calcaneus who has been NWB LLE for the past month and now has so significant change in status.  Pt is independent with mobility and at this point, pain is well-controlled with oral pain meds.  No needs for acute PT or f/u PT until pt is appropriate for outpatient.  PT signing off. PT Recommendation/Assessment: Patent does not need any further PT services No Skilled PT: Patient is modified independent with all activity/mobility PT Recommendation Follow Up Recommendations: None Equipment Recommended: None recommended by PT PT Goals  Acute Rehab PT Goals PT Goal Formulation:  (N/A) Time For Goal Achievement: Other (comment) (N/A)  PT Evaluation Precautions/Restrictions  Restrictions Weight Bearing Restrictions: Yes LLE Weight Bearing: Non weight bearing Other Position/Activity Restrictions: elevate LLE Prior Functioning  Home Living Lives With: Alone (but son is coming home in less than a wk) Receives Help From: Family Type of Home: House Home Layout:  One level Home Access: Stairs to enter Entrance Stairs-Rails: None Entrance Stairs-Number of Steps: 1 Bathroom Shower/Tub: Engineer, manufacturing systems: Standard Bathroom Accessibility: Yes How Accessible: Other (comment) (via crutches) Home Adaptive Equipment: Shower chair with back;Hand-held shower hose;Raised toilet seat with rails;Other (comment) (crutches) Prior Function Level of Independence: Independent with gait;Independent with transfers;Independent with homemaking with ambulation;Independent with basic ADLs Able to Take Stairs?: Reciprically Driving: Yes Vocation: Full time employment Vocation Requirements: unable to work currently.  Works at [employer] Arousal/Alertness: Awake/alert Overall Cognitive Status: Appears within functional limits for tasks assessed Orientation Level: Oriented X4 Sensation/Coordination Sensation Light Touch: Appears Intact Stereognosis: Not tested Hot/Cold: Not tested Proprioception: Not tested Coordination Gross Motor Movements are Fluid and Coordinated: Yes Fine Motor Movements are Fluid and Coordinated: Yes Extremity Assessment RUE Assessment RUE Assessment: Within Functional Limits LUE Assessment LUE Assessment: Within Functional Limits RLE Assessment RLE Assessment: Within Functional Limits LLE Assessment LLE Assessment: Not tested Mobility (including Balance) Bed Mobility Bed Mobility: Yes Supine to Sit: 7: Independent Sitting - Scoot to Edge of Bed: 7: Independent Transfers Transfers: Yes Sit to Stand: 6: Modified independent (Device/Increase time);With upper extremity assist;From bed;From chair/3-in-1 Stand to Sit: 6: Modified independent (Device/Increase time);With armrests;To chair/3-in-1 Ambulation/Gait Ambulation/Gait: Yes Ambulation/Gait Assistance: 6: Modified independent (Device/Increase time) (safe with ambulation with RW and crutches) Ambulation Distance (Feet): 30 Feet Assistive device:  Rolling walker;Crutches (pt tried RW but did not like as much as crutches) Gait Pattern:  (single leg hop) Stairs: No Wheelchair Mobility Wheelchair Mobility: No  Posture/Postural Control Posture/Postural Control: No  significant limitations Balance Balance Assessed: Yes Dynamic Standing Balance Dynamic Standing - Level of Assistance: 6: Modified independent (Device/Increase time) Exercise   Discussed proper positioning of the LLE while sitting, in supine, and in standing.  Discussed ROM of moveable joints. End of Session PT - End of Session Equipment Utilized During Treatment: Gait belt Activity Tolerance: Patient tolerated treatment well Patient left: in chair;with call bell in reach Nurse Communication: Mobility status for ambulation General Behavior During Session: [facility] for tasks performed Cognition: [facility] for tasks performed  Tona Qualley, [place]  [phone] 02/09/2011, 1:56 PM

## 2011-02-09 NOTE — Op Note (Signed)
NAMECHRYSTOPHER, Bryan [name]               ACCOUNT NO.:  [id number]  MEDICAL RECORD NO.:  [id number]  LOCATION:  5011                         FACILITY:  [facility]  PHYSICIAN:  Doralee Albino. Carola Frost, M.D. DATE OF BIRTH:  [birth year]  DATE OF PROCEDURE:  02/08/2011 DATE OF DISCHARGE:                              OPERATIVE REPORT   PREOPERATIVE DIAGNOSIS:  Left calcaneus.  POSTOPERATIVE DIAGNOSIS:  Left calcaneus.  PROCEDURE:  Open reduction and internal fixation of left calcaneus.  SURGEON:  Doralee Albino. Carola Frost, MD  ASSISTANT:  Mearl Latin, PA  ANESTHESIA:  General supplemented with regional block, Dr. Jean Rosenthal.  ESTIMATED BLOOD LOSS:  50.  IV FLUIDS:  1700 crystalloid.  DRAINS:  One.  TOURNIQUET:  71 minutes.  DISPOSITION:  To PACU.  CONDITION:  Stable.  BRIEF SUMMARY AND INDICATION FOR PROCEDURE:  Bryan [name] is a very pleasant 55 year old male who sustained a displaced intra-articular fracture of the calcaneus with depression of the subtalar joint, but no significant split, however, substantial lateral wall blow out.  I discussed with the patient very carefully the risks and benefits of surgery including the possibility of nonsurgical management and possible fusion and later lateral wall osteotomy should symptoms developed.  The patient was consistent and expressing his desire for definitive internal fixation and correction of his deformity as he was desirous of maximizing his capacity for return to work in maintenance of his job long term.  He understood the risks to include, the possibility of wound breakdown and infection particularly given his smoking, the importance of nonweightbearing, and compliant postoperatively with those instructions and that failure to do either could result in wound breakdown, deep infection, and limb loss.  Furthermore, we discussed DVT, PE, heart attack, stroke, progressive arthritis, loss of motion particularly of the subtalar joint, need for further  surgery including subtalar fusion and many others, again he did wish to proceed.  BRIEF DESCRIPTION OF PROCEDURE:  Bryan [name] was given a popliteal block, taken to the operating room where general anesthesia was induced.  He did receive preoperative antibiotics with Ancef.  His left lower extremity was prepped and draped in usual sterile fashion.  Tourniquet was placed about the thigh.  The leg was elevated, exsanguinated with an Esmarch bandage, and a standard extended approach performed to the calcaneus.  The lateral wall was very prominent, and blowout was more impressive under direct visualization from the CT scan.  It approximated 1.5 cm at its greatest width.  The fracture site was identified adjacent to the articular surface of the subtalar joint.  The lateral wall was peeled back revealing the depressed segment of the subtalar joint.  This was isolated with an osteotome and elevated in block into a reduced position.  The subtalar cartilage of both the talus and the calcaneus appeared to be well preserved.  This was secured provisionally with K- wires.  A graft was placed underneath this for support with 20 mL of cancellous chips, and then this was followed by placement of the DePuy calcaneal plate securing 3 screws into the tuberosity, 2 in the anterior process and 2 in the subtalar block of bone.  The patient seemed to tolerate this procedure quite  well.  Final x-rays, including lateral Broden, axial, AP and mortise ankle films showed correction into a valgus of calcaneal alignment, elimination of the lateral wall blowout, restoration of appropriate calcaneal height with maintenance of reduction of the subtalar articular joint space.  The screws appeared to be of appropriate length.  The wound was irrigated thoroughly, and then the tourniquet deflated.  Hemostasis was obtained with electrocautery in and around the flap.  The flap was replaced.  A deep drain was positioned  underneath this, and then a layered closure with 2-0 Vicryl, 3-0 Vicryl, and nylon performs with a sterile gently compressive dressing and splint.  The patient was awakened from anesthesia and transported to the PACU in stable condition.  Montez Morita, PA-C assisted me during the procedure and was necessary for the safe and effective completion of the case as he had to control the position of the hindfoot and restore appropriate valgus rather than allowing it to relax into a varus position during instrumentation.  PROGNOSIS:  Bryan [name] will be nonweightbearing for the next 8 weeks with graduated weightbearing thereafter.  We anticipate a 1-2 day stay in the hospital and discharge to home with crutches as he has been over the last several weeks waiting for adequate soft tissue resolution.  The patient is at increased risk for soft tissue complications given his smoking history, but we are hopeful this would be mitigated by the significant curtailment down to few cigarettes daily per the patient, only just minutes ago as I conferred with his neighbor who accompanied him to surgery today did alarm that the patient is also a regular user of alcohol and this could affect his ability to comply postoperatively and could result in significant complications.  He will be on DVT prophylaxis with Lovenox, and with DT prophylaxis as well.     Doralee Albino. Carola Frost, M.D.     [facility]/MEDQ  D:  02/08/2011  T:  02/09/2011  Job:  [id number]

## 2011-02-09 NOTE — Progress Notes (Cosign Needed)
Subjective: 1 Day Post-Op Procedure(s) (LRB): OPEN REDUCTION INTERNAL FIXATION (ORIF) CALCANEOUS FRACTURE (Left)  Doing well Block wore off about 1 hour ago Pain ok at current time No cp, no sob Feeling good but requiring IV pain meds  Objective: Current Vitals Blood pressure 110/77, pulse 78, temperature 98.1 F (36.7 C), temperature source Oral, resp. rate 18, weight 61.7 kg (136 lb 0.4 oz), SpO2 100.00%. Vital signs in last 24 hours: Temp:  [97.3 F (36.3 C)-98.1 F (36.7 C)] 98.1 F (36.7 C) (12/12 0601) Pulse Rate:  [57-83] 78  (12/12 0601) Resp:  [9-22] 18  (12/12 0601) BP: (108-146)/(61-96) 110/77 mmHg (12/12 0601) SpO2:  [100 %] 100 % (12/12 0601)  Intake/Output from previous day: 12/11 0701 - 12/12 0700 In: 2670 [P.O.:720; I.V.:1950] Out: 1055 [Urine:1000; Drains:5; Blood:50]  LABS No results found for this basename: HGB:5 in the last 72 hours No results found for this basename: WBC:2,RBC:2,HCT:2,PLT:2 in the last 72 hours No results found for this basename: NA:2,K:2,CL:2,CO2:2,BUN:2,CREATININE:2,GLUCOSE:2,CALCIUM:2 in the last 72 hours No results found for this basename: LABPT:2,INR:2 in the last 72 hours   Physical Exam  Gen: NAD, comfortable Lungs:clear Cardiac:reg Abd:+ BS Ext: L lower Extremity  Splint fitting well  Did soak through but appears stable now without active bleeding  Motor intact  Still with decreased sensation secondary to block  Ext is warm   Good color and brisk cap refill   Imaging Dg Os Calcis Left  02/08/2011  *RADIOLOGY REPORT*  Clinical Data: ORIF left calcaneus.  LEFT OS CALCIS - 2+ VIEW  Comparison: CT 01/11/2011  Findings: Multiple intraoperative spot images demonstrate plate screw fixation of the left calcaneus.  Near anatomic alignment on the spot images.  No complicating features.  IMPRESSION: Internal fixation left calcaneal fracture.  Original Report Authenticated By: Cyndie Chime, M.D.   Dg Ankle Left  Port  02/08/2011  *RADIOLOGY REPORT*  Clinical Data: Postop.  PORTABLE LEFT ANKLE - 2 VIEW  Comparison: Left ankle intraoperative radiographs 02/08/2011 and ankle radiographs 01/11/2011.  Findings: Splint material surrounds the ankle and visualized foot. Postoperative changes of ORIF with plate and screw fixation of a calcaneus fracture.  Bony detail is obscured by the plan.  No complicating features identified to  IMPRESSION: ORIF for a left calcaneus fracture.  Bony detail obscured by overlying splint material. No complicating feature identified.  Original Report Authenticated By: Britta Mccreedy, M.D.   Dg C-arm 1-60 Min  02/08/2011  CLINICAL DATA: orif left calcaneous   C-ARM 1-60 MINUTES  Fluoroscopy was utilized by the requesting physician.  No radiographic  interpretation.      Assessment/Plan: 1 Day Post-Op Procedure(s) (LRB): OPEN REDUCTION INTERNAL FIXATION (ORIF) CALCANEOUS FRACTURE (Left)  55 y/o male s/p ORIF L calcaneus  1. L calcaneus fracture s/p ORIF  NWB L leg x 8 weeks  D/c drain tomorrow  Will likely change splint tomorrow and place in CAM boot or similar to allow early ROM  Continue with Ice and elevation  PT eval 2. Pain  PCA for now, will stop this pm  Encouraged PO meds  Also added lyrica post op for multimodal analgesia 3. FEN  As tolerated 4. Nicotine dependence  Continued to review negative consequences of continued nicotine use 5. EtOH dependence  Drink with dinner daily  Not ready to give up 6. DVT/PE prophylaxis  Lovenox, will continue x 14 days after surgery  Pt with risk factors for DVT, including ortho surgery, relative immobility and smoking 7. Dispo  PT  eval  Likely d/c home tomorrow with [facility]   Mearl Latin, PA-C 02/09/2011, 8:14 AM

## 2011-02-10 LAB — BASIC METABOLIC PANEL
CO2: 26 mEq/L (ref 19–32)
Calcium: 8.9 mg/dL (ref 8.4–10.5)
Chloride: 100 mEq/L (ref 96–112)
Creatinine, Ser: 0.57 mg/dL (ref 0.50–1.35)
GFR calc Af Amer: >90 mL/min (ref >90)
Sodium: 133 mEq/L — ABNORMAL LOW (ref 135–145)

## 2011-02-10 LAB — CBC
MCV: 98.1 fL (ref 78.0–100.0)
Platelets: 129 10*3/uL — ABNORMAL LOW (ref 150–400)
RBC: 3.64 MIL/uL — ABNORMAL LOW (ref 4.22–5.81)
RDW: 12.4 % (ref 11.5–15.5)
WBC: 5.5 10*3/uL (ref 4.0–10.5)

## 2011-02-10 MED ORDER — DSS 100 MG PO CAPS: 100.0000 mg | ORAL_CAPSULE | Freq: Two times a day (BID) | ORAL | Status: AC

## 2011-02-10 MED ORDER — OXYCODONE HCL 5 MG PO TABS: 5.0000 mg | ORAL_TABLET | ORAL | Status: AC | PRN

## 2011-02-10 MED ORDER — ENOXAPARIN (LOVENOX) PATIENT EDUCATION KIT
PACK | Freq: Once | Status: DC
  Filled 2011-02-10: qty 1

## 2011-02-10 MED ORDER — ENOXAPARIN (LOVENOX) PATIENT EDUCATION KIT: 1.0000 | PACK | Freq: Once | Status: DC

## 2011-02-10 MED ORDER — OXYCODONE-ACETAMINOPHEN 5-325 MG PO TABS: 1.0000 | ORAL_TABLET | Freq: Four times a day (QID) | ORAL | Status: AC | PRN

## 2011-02-10 MED ORDER — PREGABALIN 75 MG PO CAPS: 75.0000 mg | ORAL_CAPSULE | Freq: Two times a day (BID) | ORAL | Status: DC

## 2011-02-10 MED ORDER — ENOXAPARIN SODIUM 40 MG/0.4ML ~~LOC~~ SOLN: 40.0000 mg | SUBCUTANEOUS | Status: DC

## 2011-02-10 MED ORDER — METHOCARBAMOL 500 MG PO TABS: 500.0000 mg | ORAL_TABLET | Freq: Four times a day (QID) | ORAL | Status: AC | PRN

## 2011-02-10 NOTE — Discharge Summary (Addendum)
  D/c summary dictated  #[id number]  Mearl Latin, PA-C 02/10/2011 [phone]

## 2011-02-10 NOTE — Progress Notes (Signed)
Subjective: 2 Days Post-Op Procedure(s) (LRB): OPEN REDUCTION INTERNAL FIXATION (ORIF) CALCANEOUS FRACTURE (Left)  Doing well, ready to go home Pain controlled with PO meds, percocet every 5 hours or so No issues  Objective: Current Vitals Blood pressure 121/80, pulse 73, temperature 98.7 F (37.1 C), temperature source Oral, resp. rate 18, weight 61.7 kg (136 lb 0.4 oz), SpO2 99.00%. Vital signs in last 24 hours: Temp:  [97 F (36.1 C)-98.7 F (37.1 C)] 98.7 F (37.1 C) (12/13 0602) Pulse Rate:  [73-88] 73  (12/13 0602) Resp:  [18-20] 18  (12/13 0602) BP: (105-121)/(69-80) 121/80 mmHg (12/13 0602) SpO2:  [97 %-99 %] 99 % (12/13 0602)  Intake/Output from previous day: 12/12 0701 - 12/13 0700 In: 965 [P.O.:740; I.V.:225] Out: 2779 [Urine:2775; Drains:4]  LABS  Basename 02/10/11 0610  HGB 12.1*    Basename 02/10/11 0610  WBC 5.5  RBC 3.64*  HCT 35.7*  PLT 129*    Basename 02/10/11 0610  NA 133*  K 4.3  CL 100  CO2 26  BUN 6  CREATININE 0.57  GLUCOSE 108*  CALCIUM 8.9   No results found for this basename: LABPT:2,INR:2 in the last 72 hours    Physical Exam  Gen: NAD, appears comfortable Lungs:clear Cardiac:reg Abd:+ BS Ext: L Lower Extremity  Extremity is warm   Brisk refill  Drain removed without difficulty  Splint dry, no further drainage noted  Motor and sensory intact  No pain with passive stretching   Imaging Dg Os Calcis Left  02/08/2011  *RADIOLOGY REPORT*  Clinical Data: ORIF left calcaneus.  LEFT OS CALCIS - 2+ VIEW  Comparison: CT 01/11/2011  Findings: Multiple intraoperative spot images demonstrate plate screw fixation of the left calcaneus.  Near anatomic alignment on the spot images.  No complicating features.  IMPRESSION: Internal fixation left calcaneal fracture.  Original Report Authenticated By: Cyndie Chime, M.D.   Dg Ankle Left Port  02/08/2011  *RADIOLOGY REPORT*  Clinical Data: Postop.  PORTABLE LEFT ANKLE - 2 VIEW   Comparison: Left ankle intraoperative radiographs 02/08/2011 and ankle radiographs 01/11/2011.  Findings: Splint material surrounds the ankle and visualized foot. Postoperative changes of ORIF with plate and screw fixation of a calcaneus fracture.  Bony detail is obscured by the plan.  No complicating features identified to  IMPRESSION: ORIF for a left calcaneus fracture.  Bony detail obscured by overlying splint material. No complicating feature identified.  Original Report Authenticated By: Britta Mccreedy, M.D.   Dg C-arm 1-60 Min  02/08/2011  CLINICAL DATA: orif left calcaneous   C-ARM 1-60 MINUTES  Fluoroscopy was utilized by the requesting physician.  No radiographic  interpretation.      Assessment/Plan: 2 Days Post-Op Procedure(s) (LRB): OPEN REDUCTION INTERNAL FIXATION (ORIF) CALCANEOUS FRACTURE (Left)  55 y/o male s/p ORIF L calcaneus   1. L calcaneus fracture s/p ORIF   NWB L leg x 8 weeks   Continue with current splint  Continue with Ice and elevation   2. Pain    Encouraged PO meds  3. FEN   As tolerated  4. Nicotine dependence   Continued to review negative consequences of continued nicotine use  5. EtOH dependence   Drink with dinner daily   Not ready to give up  6. DVT/PE prophylaxis   Lovenox, will continue x 7 days after surgery   Pt with risk factors for DVT, including ortho surgery, relative immobility and smoking  7. Dispo   D/c home today   Mearl Latin,  PA-C 02/10/2011, 9:03 AM

## 2011-02-10 NOTE — Progress Notes (Signed)
Occupational Therapy Evaluation Patient Details Name: Bryan [name] MRN: [id number] DOB: 09-Feb-1956 Today's Date: 02/10/2011 Time: 8:18-8:41am Ev II; 1 Indirect Problem List:  Patient Active Problem List  Diagnoses  . Calcaneus fracture, left  . Nicotine dependence  . Alcohol dependence    Past Medical History:  Past Medical History  Diagnosis Date  . Joint pain     left heel  . Joint swelling     calcaneus fracture  . Curvature of spine    Past Surgical History:  Past Surgical History  Procedure Date  . Foot surgery 30+yrs ago    left   . Finger surgery 30+yrs ago    right pointer finger  . Knee arthroscopy at age 31    right   . Orif calcaneous fracture 02/08/2011    Procedure: OPEN REDUCTION INTERNAL FIXATION (ORIF) CALCANEOUS FRACTURE;  Surgeon: Budd Palmer;  Location: [facility] OR;  Service: Orthopedics;  Laterality: Left;  Left Ankle.    OT Assessment/Plan/Recommendation OT Assessment Clinical Impression Statement: Pt is overall Mod I ADL's and selfcare tasks while maintaining NWB status L LE. He reports that he has all DME & his son will be able to assist PRN at d/c. Pt has been NWB L LE prior to this hospitialization and reports no further needs at this time, will sign off acute OT. OT Recommendation/Assessment: Patient does not need any further OT services OT Recommendation Equipment Recommended: None recommended by OT    OT Evaluation Precautions/Restrictions  Precautions Precautions: Fall;Other (comment) (NWB L LE) Restrictions Weight Bearing Restrictions: Yes LLE Weight Bearing: Non weight bearing Other Position/Activity Restrictions: elevate LLE Prior Functioning Home Living Lives With: Alone (but son is coming home in less than a wk) Receives Help From: Family Type of Home: House Home Layout: One level Home Access: Stairs to enter Entrance Stairs-Rails: None Entrance Stairs-Number of Steps: 1 Bathroom Shower/Tub: Agricultural engineer: Standard Bathroom Accessibility: Yes How Accessible: Other (comment) (via crutches) Home Adaptive Equipment: Shower chair with back;Hand-held shower hose;Raised toilet seat with rails;Other (comment) (crutches) Prior Function Level of Independence: Independent with gait;Independent with transfers;Independent with homemaking with ambulation;Independent with basic ADLs Driving: Yes Vocation: Full time employment ADL ADL Eating/Feeding: Performed;Modified independent Where Assessed - Eating/Feeding: Bed level Grooming: Performed;Wash/dry hands;Wash/dry face;Teeth care;Brushing hair;Modified independent Where Assessed - Grooming: Supine, head of bed up;Sitting, bed Upper Body Bathing: Modified independent;Performed Where Assessed - Upper Body Bathing: Unsupported;Sitting, bed Upper Body Dressing: Simulated;Modified independent Where Assessed - Upper Body Dressing: Sitting, bed Lower Body Dressing: Performed;Modified independent;Other (comment) (R LE) Where Assessed - Lower Body Dressing: Supine, head of bed up Toilet Transfer: Performed;Modified independent Toilet Transfer Method: Ambulating;Other (comment) (w/ crutches) Toilet Transfer Equipment: Regular height toilet;Other (comment) (crutches, while maintaining NWB L LE) Toileting - Clothing Manipulation: Performed;Modified independent Where Assessed - Toileting Clothing Manipulation: Standing Toileting - Hygiene: Simulated;Modified independent Where Assessed - Toileting Hygiene: Sit on 3-in-1 or toilet Equipment Used: Other (comment) (crutches) ADL Comments: Pt is overall Mod I ADL's and selfcare tasks while maintaining NWB status L LE. He reports that he has all DME & his son will be able to assist PRN at d/c. Pt has been NWB L LE prior to this hospitialization and reports no further needs at this time. Vision/Perception  Vision - History Baseline Vision: Wears glasses all the time Patient Visual Report: No change from  baseline Cognition Cognition Arousal/Alertness: Awake/alert Overall Cognitive Status: Appears within functional limits for tasks assessed Orientation Level: Oriented X4 Sensation/Coordination Sensation Light  Touch: Appears Intact Coordination Gross Motor Movements are Fluid and Coordinated: Yes Fine Motor Movements are Fluid and Coordinated: Yes Extremity Assessment RUE Assessment RUE Assessment: Within Functional Limits LUE Assessment LUE Assessment: Within Functional Limits Mobility  Bed Mobility Bed Mobility: Yes Supine to Sit: 6: Modified independent (Device/Increase time) Sitting - Scoot to Edge of Bed: 6: Modified independent (Device/Increase time) Transfers Transfers: Yes Sit to Stand: 6: Modified independent (Device/Increase time);Without upper extremity assist;From bed;From toilet Stand to Sit: 6: Modified independent (Device/Increase time);Without upper extremity assist;To bed;To toilet   End of Session OT - End of Session Equipment Utilized During Treatment: Gait belt;Other (comment) (Crutches) Activity Tolerance: Patient tolerated treatment well Patient left: in bed;with call bell in reach General Behavior During Session: [facility] for tasks performed Cognition: [facility] for tasks performed   Alm Bustard 02/10/2011, 11:23 AM

## 2011-02-10 NOTE — Discharge Summary (Signed)
[name], [name]               ACCOUNT NO.:  [id number]  MEDICAL RECORD NO.:  [id number]  LOCATION:  5011                         FACILITY:  [facility]  PHYSICIAN:  Doralee Albino. Carola Frost, M.D. DATE OF BIRTH:  31-May-1955  DATE OF ADMISSION:  02/08/2011 DATE OF DISCHARGE:                              DISCHARGE SUMMARY   DISCHARGE DIAGNOSIS:  Comminuted left intra-articular calcaneus fracture.  ADDITIONAL DISCHARGE DIAGNOSES: 1. Nicotine dependence. 2. Alcohol dependence.  PROCEDURES PERFORMED:  On February 08, 2011, ORIF, left calcaneus.  BRIEF HISTORY AND HOSPITAL COURSE:  Bryan [name] is a very pleasant 9- year-old male who sustained a displaced intra-articular calcaneus fracture with involvement of the subtalar joint about 3 weeks ago. Secondary to swelling, we were unable to proceed with acute fixation and needed a soft tissue quality to allow for adequate resolution of the swelling.  The patient finally achieved this and he also was able to dramatically decrease his nicotine intake, and we decided that operative fixation would be advantageous to the patient, not only because of is intra-articular involvement but also because of significant lateral wall blowout.  The patient was brought to the operating room on February 08, 2011 and underwent the procedure described up above.  The patient's anesthesia was augmented in a regional block, which did provide the patient a significant amount of relief.  After surgery, the patient was brought to the PACU for recovery from anesthesia and was transported up to the orthopedic floor for continued observation and pain control.  The patient's hospital stay was relatively uncomplicated.  Postoperative day #1, his block did wear off around 6 o'clock in the morning and was requiring PCA from that point to help control his pain, but it was controllable none the less.  No chest pain or shortness of breath were noted.  The patient worked with physical  therapy very well on postoperative day #1 without any acute issues.  On postoperative day #2, the patient was completely converted to oral pain medications.  Pain was very well controlled, and the patient had mobilized safely with physical therapy and was deemed stable to discharge to home.  Clinical encounter note for postoperative day #2 is as follows: Subjective/Objective:  The patient is doing well, ready to go home. Pain control with oral medications.  No issues.  No chest pain.  No shortness of breath.  No numbness or tingling.  No additional issues noted.  PHYSICAL EXAMINATION:  VITAL SIGNS:  Temperature 98.7, heart rate 73, respirations 18 at 99% on room air, and BP is 121/80. GENERAL:  In no acute distress, appears comfortable. LUNGS:  Clear bilaterally. CARDIAC:  S1, S2 and regular. ABDOMEN:  Soft, nontender with positive bowel sounds. LEFT LOWER EXTREMITY:  Extremity is warm.  Splint is stable, clean dry and intact.  No further bloody drainage is noted.  His drain was removed without difficulty.  Distal, motor, and sensory functions are intact. No pain with passive stretching.  Hemoglobin of 12.1, hematocrit 35.7. Sodium 133 potassium 4.3, chloride 100, bicarb 26, BUN 6, creatinine 0.57, and glucose 108.  ASSESSMENT AND PLAN:  A 55 year old male status post open reduction and internal fixation of left calcaneus fracture. 1.  Left calcaneus fracture status post open reduction and internal     fixation, nonweightbearing x8 weeks.  Continue with current splint.     Continue with ice and elevation. 2. Pain.  Continue with Percocet, OxyIR, Robaxin and Lyrica for     multimodal analgesia. 3. Fluids, electrolytes, nutrition.  As tolerated. 4. Nicotine dependence.  Again, continued to review, negative     consequences such as wound breakdown and nonunion with persistent     nicotine use. 5. Ethyl alcohol dependence. 6. Deep venous thrombosis, pulmonary embolus prophylaxis.  We  will     continue Lovenox for a total of 7 days postoperatively.  Therefore,     the patient was given a script for 5 days of Lovenox.  The patient     does have risk for DVT including ortho surgery, immobility and     smoking.  FOLLOWUP:  Follow up with Orthopedics in 5-7 days for re-evaluation. Follow up x-rays and evaluation of the wound.  I would anticipate sutures staying in for a total of 3 weeks or so and this will all be dependent upon smoking cessation and so forth.  CONSULTS:  None.  DISCHARGE CONDITION:  Stable.  SIGNIFICANT DIAGNOSTIC STUDIES:  Radiology demonstrates a successful reduction and stable fixation of complex left calcaneus fracture.  TREATMENTS:  While inpatient, the patient received IV hydration, normal saline with 20 of K.  He did receive perioperative prophylactic antibiotics including Ancef.  Analgesia consisted of PCA followed by conversion to oral pain medications including Percocet 5/325, OxyIR 5 mg, Robaxin 500 mg, and Lyrica 75 mg.  Anticoagulation consisted of Lovenox 40 mg subcu daily for DVT/PE prophylaxis.  The patient received therapies as an inpatient as well including PT and RN.  DISPOSITION:  The patient will be discharged home with home health as needed.  DISCHARGE MEDICATIONS: 1. Colace 100 mg 1 p.o. b.i.d. as needed. 2. Lovenox 40 mg 1 subcu daily x5 days. 3. Robaxin 500 mg 1-2 p.o. q.6 hours as needed. 4. Oxycodone 5 mg 1-2 p.o. q.3 hours as needed for breakthrough pain. 5. Percocet 5/325 one-two p.o. q.6 hours as needed for pain. 6. Lyrica 75 mg 1 p.o. b.i.d. 7. The patient was stopped taking ibuprofen.  DISCHARGE INSTRUCTIONS AND PLAN:  Bryan [name] did sustain a very significant injury to his left lower extremity, however, we were able to achieve excellent fixation with ORIF.  He will be nonweightbearing for the next 8 weeks and we will maintain the splint for the next several days.  We will check him back in the office in 5-7  days for re- evaluation with evaluation of his wound.  We will likely place him into a night splint or Cam boot at that time to allow him to begin very gentle range of motion.  His sutures will likely remain in place for the next 3 weeks given the fact that this is a watershed area classifying the flap is always at risk.  This also depends on his nicotine intake as well.  We will likely have the patient begin outpatient physical therapy soon after his wound is stable to work on ankle as well as very gentle subtalar motion.  The patient again will maintain the splint until followup.  He has been given a wound care instruction sheet with his discharge paperwork.  He will continue to remain on Lovenox for the next 5 days for DVT/PE prophylaxis.  I do not feel that the patient will need any additional pharmacologic  prophylaxis beyond this as I anticipate him to be fairly mobile.  The patient is on Percocet, oxycodone, Robaxin, and Lyrica for multimodal analgesia and this combination appears to work fairly well for the patient.  Should the patient have any questions prior to his followup, he is encouraged to contact the office at [phone].  He is to keep his splint clean, dry, and free of water. Should the splint got wet, he is to contact the office immediately.     Mearl Latin, PA   ______________________________ Doralee Albino. Carola Frost, M.D.    KWP/MEDQ  D:  02/10/2011  T:  02/10/2011  Job:  [id number]

## 2011-02-10 NOTE — Progress Notes (Signed)
Patient states he does not need any DME. I contacted [facility] for [facility] services and was referred to [facility] [phone]) to initiate therapy. Spoke with Durel Salts who took patient information. Patient should expect call today or tomorrow from [facility] who will schedule pt HH PT [phone]).

## 2011-03-04 ENCOUNTER — Ambulatory Visit: Payer: Worker's Compensation | Attending: Orthopedic Surgery | Admitting: Physical Therapy

## 2011-03-04 DIAGNOSIS — IMO0001 Reserved for inherently not codable concepts without codable children: Secondary | ICD-10-CM | POA: Insufficient documentation

## 2011-03-04 DIAGNOSIS — M25673 Stiffness of unspecified ankle, not elsewhere classified: Secondary | ICD-10-CM | POA: Insufficient documentation

## 2011-03-04 DIAGNOSIS — M25676 Stiffness of unspecified foot, not elsewhere classified: Secondary | ICD-10-CM | POA: Insufficient documentation

## 2011-03-04 DIAGNOSIS — R269 Unspecified abnormalities of gait and mobility: Secondary | ICD-10-CM | POA: Insufficient documentation

## 2011-03-11 ENCOUNTER — Ambulatory Visit: Payer: Worker's Compensation | Admitting: Physical Therapy

## 2011-03-18 ENCOUNTER — Ambulatory Visit: Payer: Worker's Compensation | Admitting: Physical Therapy

## 2011-03-25 ENCOUNTER — Ambulatory Visit: Payer: Worker's Compensation | Admitting: Physical Therapy

## 2011-03-25 NOTE — Discharge Summary (Signed)
Bryan [name], [name] NO.:  [id number]  MEDICAL RECORD NO.:  [id number]  LOCATION:  5011                         FACILITY:  [facility]  PHYSICIAN:  Mearl Latin, PA       DATE OF BIRTH:  08/08/1955  DATE OF ADMISSION:  02/08/2011 DATE OF DISCHARGE:  02/10/2011                              DISCHARGE SUMMARY   DISCHARGE DIAGNOSIS:  Comminuted left intra-articular calcaneus fracture.  ADDITIONAL DISCHARGE DIAGNOSES: 1. Nicotine dependence. 2. Alcohol dependence.  PROCEDURES PERFORMED:  On February 08, 2011, ORIF, left calcaneus.  BRIEF HISTORY AND HOSPITAL COURSE:  Bryan [name] is a very pleasant 56- year-old male who sustained a displaced intra-articular calcaneus fracture with involvement of Bryan subtalar joint about 3 weeks ago. Secondary to swelling, we were unable to proceed with acute fixation and needed a soft tissue quality to allow for adequate resolution of Bryan swelling.  Bryan [name] finally achieved this and he also was able to dramatically decrease his nicotine intake, and we decided that operative fixation would be advantageous to Bryan [name], not only because of is intra-articular involvement but also because of significant lateral wall blowout.  Bryan [name] was brought to Bryan operating room on February 08, 2011 and underwent Bryan procedure described up above.  Bryan [name]'s anesthesia was augmented in a regional block, which did provide Bryan [name] a significant amount of relief.  After surgery, Bryan [name] was brought to Bryan PACU for recovery from anesthesia and was transported up to Bryan orthopedic floor for continued observation and pain control.  Bryan [name]'s hospital stay was relatively uncomplicated.  Postoperative day #1, his block did wear off around 6 o'clock in Bryan morning and was requiring PCA from that point to help control his pain, but it was controllable none Bryan less.  No chest pain or shortness of breath were noted.  Bryan [name] worked  with physical therapy very well on postoperative day #1 without any acute issues.  On postoperative day #2, Bryan [name] was completely converted to oral pain medications.  Pain was very well controlled, and Bryan [name] had mobilized safely with physical therapy and was deemed stable to discharge to home.  Clinical encounter note for postoperative day #2 is as follows: Subjective/Objective:  Bryan [name] is doing well, ready to go home. Pain control with oral medications.  No issues.  No chest pain.  No shortness of breath.  No numbness or tingling.  No additional issues noted.  PHYSICAL EXAMINATION:  VITAL SIGNS:  Temperature 98.7, heart rate 73, respirations 18 at 99% on room air, and BP is 121/80. GENERAL:  In no acute distress, appears comfortable. LUNGS:  Clear bilaterally. CARDIAC:  S1, S2 and regular. ABDOMEN:  Soft, nontender with positive bowel sounds. LEFT LOWER EXTREMITY:  Extremity is warm.  Splint is stable, clean dry and intact.  No further bloody drainage is noted.  His drain was removed without difficulty.  Distal, motor, and sensory functions are intact. No pain with passive stretching.  Hemoglobin of 12.1, hematocrit 35.7. Sodium 133 potassium 4.3, chloride 100, bicarb 26, BUN 6, creatinine 0.57, and glucose 108.  ASSESSMENT AND PLAN:  A 56 year old male status post open reduction  and internal fixation of left calcaneus fracture. 1. Left calcaneus fracture status post open reduction and internal     fixation, nonweightbearing x8 weeks.  Continue with current splint.     Continue with ice and elevation. 2. Pain.  Continue with Percocet, OxyIR, Robaxin and Lyrica for     multimodal analgesia. 3. Fluids, electrolytes, nutrition.  As tolerated. 4. Nicotine dependence.  Again, continued to review, negative     consequences such as wound breakdown and nonunion with persistent     nicotine use. 5. Ethyl alcohol dependence. 6. Deep venous thrombosis, pulmonary embolus  prophylaxis.  We will     continue Lovenox for a total of 7 days postoperatively.  Therefore,     Bryan [name] was given a script for 5 days of Lovenox.  Bryan [name]     does have risk for DVT including ortho surgery, immobility and     smoking.  FOLLOWUP:  Follow up with Orthopedics in 5-7 days for re-evaluation. Follow up x-rays and evaluation of Bryan wound.  I would anticipate sutures staying in for a total of 3 weeks or so and this will all be dependent upon smoking cessation and so forth.  CONSULTS:  None.  DISCHARGE CONDITION:  Stable.  SIGNIFICANT DIAGNOSTIC STUDIES:  Radiology demonstrates a successful reduction and stable fixation of complex left calcaneus fracture.  TREATMENTS:  While inpatient, Bryan [name] received IV hydration, normal saline with 20 of K.  He did receive perioperative prophylactic antibiotics including Ancef.  Analgesia consisted of PCA followed by conversion to oral pain medications including Percocet 5/325, OxyIR 5 mg, Robaxin 500 mg, and Lyrica 75 mg.  Anticoagulation consisted of Lovenox 40 mg subcu daily for DVT/PE prophylaxis.  Bryan [name] received therapies as an inpatient as well including PT and RN.  DISPOSITION:  Bryan [name] will be discharged home with home health as needed.  DISCHARGE MEDICATIONS: 1. Colace 100 mg 1 p.o. b.i.d. as needed. 2. Lovenox 40 mg 1 subcu daily x5 days. 3. Robaxin 500 mg 1-2 p.o. q.6 hours as needed. 4. Oxycodone 5 mg 1-2 p.o. q.3 hours as needed for breakthrough pain. 5. Percocet 5/325 one-two p.o. q.6 hours as needed for pain. 6. Lyrica 75 mg 1 p.o. b.i.d. 7. Bryan [name] was stopped taking ibuprofen.  DISCHARGE INSTRUCTIONS AND PLAN:  Bryan [name] did sustain a very significant injury to his left lower extremity, however, we were able to achieve excellent fixation with ORIF.  He will be nonweightbearing for Bryan next 8 weeks and we will maintain Bryan splint for Bryan next several days.  We will check him back in Bryan  office in 5-7 days for re- evaluation with evaluation of his wound.  We will likely place him into a night splint or Cam boot at that time to allow him to begin very gentle range of motion.  His sutures will likely remain in place for Bryan next 3 weeks given Bryan fact that this is a watershed area classifying Bryan flap is always at risk.  This also depends on his nicotine intake as well.  We will likely have Bryan [name] begin outpatient physical therapy soon after his wound is stable to work on ankle as well as very gentle subtalar motion.  Bryan [name] again will maintain Bryan splint until followup.  He has been given a wound care instruction sheet with his discharge paperwork.  He will continue to remain on Lovenox for Bryan next 5 days for DVT/PE prophylaxis.  I do not feel  that Bryan [name] will need any additional pharmacologic prophylaxis beyond this as I anticipate him to be fairly mobile.  Bryan [name] is on Percocet, oxycodone, Robaxin, and Lyrica for multimodal analgesia and this combination appears to work fairly well for Bryan [name].  Should Bryan [name] have any questions prior to his followup, he is encouraged to contact Bryan office at [phone].  He is to keep his splint clean, dry, and free of water. Should Bryan splint got wet, he is to contact Bryan office immediately.     Mearl Latin, PA     KWP/MEDQ  D:  02/10/2011  T:  03/25/2011  Job:  [id number]

## 2011-04-01 ENCOUNTER — Ambulatory Visit: Payer: Worker's Compensation | Attending: Orthopedic Surgery | Admitting: Physical Therapy

## 2011-04-01 DIAGNOSIS — R269 Unspecified abnormalities of gait and mobility: Secondary | ICD-10-CM | POA: Insufficient documentation

## 2011-04-01 DIAGNOSIS — M25676 Stiffness of unspecified foot, not elsewhere classified: Secondary | ICD-10-CM | POA: Insufficient documentation

## 2011-04-01 DIAGNOSIS — IMO0001 Reserved for inherently not codable concepts without codable children: Secondary | ICD-10-CM | POA: Insufficient documentation

## 2011-04-01 DIAGNOSIS — M25673 Stiffness of unspecified ankle, not elsewhere classified: Secondary | ICD-10-CM | POA: Insufficient documentation

## 2011-04-08 ENCOUNTER — Ambulatory Visit: Payer: Worker's Compensation | Admitting: Physical Therapy

## 2011-04-12 ENCOUNTER — Ambulatory Visit: Payer: Worker's Compensation | Admitting: Physical Therapy

## 2011-04-15 ENCOUNTER — Ambulatory Visit: Payer: Worker's Compensation | Admitting: Physical Therapy

## 2011-04-19 ENCOUNTER — Ambulatory Visit: Payer: Worker's Compensation | Admitting: Physical Therapy

## 2011-04-22 ENCOUNTER — Ambulatory Visit: Payer: Worker's Compensation | Admitting: Physical Therapy

## 2011-04-26 ENCOUNTER — Ambulatory Visit: Payer: Worker's Compensation | Admitting: Physical Therapy

## 2011-04-29 ENCOUNTER — Ambulatory Visit: Payer: Worker's Compensation | Attending: Orthopedic Surgery | Admitting: Physical Therapy

## 2011-04-29 DIAGNOSIS — M25673 Stiffness of unspecified ankle, not elsewhere classified: Secondary | ICD-10-CM | POA: Insufficient documentation

## 2011-04-29 DIAGNOSIS — M25676 Stiffness of unspecified foot, not elsewhere classified: Secondary | ICD-10-CM | POA: Insufficient documentation

## 2011-04-29 DIAGNOSIS — IMO0001 Reserved for inherently not codable concepts without codable children: Secondary | ICD-10-CM | POA: Insufficient documentation

## 2011-04-29 DIAGNOSIS — R269 Unspecified abnormalities of gait and mobility: Secondary | ICD-10-CM | POA: Insufficient documentation

## 2011-05-03 ENCOUNTER — Ambulatory Visit: Payer: Worker's Compensation | Admitting: Physical Therapy

## 2011-05-06 ENCOUNTER — Ambulatory Visit: Payer: Worker's Compensation | Admitting: Physical Therapy

## 2011-05-09 ENCOUNTER — Ambulatory Visit: Payer: Worker's Compensation | Admitting: Physical Therapy

## 2011-05-10 ENCOUNTER — Encounter: Payer: Self-pay | Admitting: Physical Therapy

## 2011-05-11 ENCOUNTER — Ambulatory Visit: Payer: Worker's Compensation | Admitting: Physical Therapy

## 2011-05-12 ENCOUNTER — Encounter: Payer: Self-pay | Admitting: Physical Therapy

## 2011-05-13 ENCOUNTER — Ambulatory Visit: Payer: Worker's Compensation | Admitting: Physical Therapy

## 2011-05-13 ENCOUNTER — Encounter: Payer: Self-pay | Admitting: Physical Therapy

## 2011-05-16 ENCOUNTER — Ambulatory Visit: Payer: Worker's Compensation | Admitting: Physical Therapy

## 2011-05-17 ENCOUNTER — Encounter: Payer: Self-pay | Admitting: Physical Therapy

## 2011-05-18 ENCOUNTER — Ambulatory Visit: Payer: Worker's Compensation | Admitting: Physical Therapy

## 2011-05-20 ENCOUNTER — Encounter: Payer: Self-pay | Admitting: Physical Therapy

## 2011-05-20 ENCOUNTER — Ambulatory Visit: Payer: Worker's Compensation | Admitting: Physical Therapy

## 2011-05-23 ENCOUNTER — Ambulatory Visit: Payer: Worker's Compensation | Admitting: Physical Therapy

## 2011-05-24 ENCOUNTER — Encounter: Payer: Self-pay | Admitting: Physical Therapy

## 2011-05-25 ENCOUNTER — Ambulatory Visit: Payer: Worker's Compensation | Admitting: Physical Therapy

## 2011-05-26 ENCOUNTER — Encounter: Payer: Self-pay | Admitting: Physical Therapy

## 2011-05-26 ENCOUNTER — Ambulatory Visit: Payer: Worker's Compensation | Admitting: Physical Therapy

## 2011-05-31 ENCOUNTER — Ambulatory Visit: Payer: Worker's Compensation | Admitting: Physical Therapy

## 2011-06-02 ENCOUNTER — Ambulatory Visit: Payer: Worker's Compensation | Admitting: Physical Therapy

## 2011-06-03 ENCOUNTER — Ambulatory Visit: Payer: Worker's Compensation | Admitting: Physical Therapy

## 2011-06-21 ENCOUNTER — Ambulatory Visit: Payer: Worker's Compensation | Attending: Orthopedic Surgery | Admitting: Physical Therapy

## 2011-06-21 DIAGNOSIS — M25673 Stiffness of unspecified ankle, not elsewhere classified: Secondary | ICD-10-CM | POA: Insufficient documentation

## 2011-06-21 DIAGNOSIS — IMO0001 Reserved for inherently not codable concepts without codable children: Secondary | ICD-10-CM | POA: Insufficient documentation

## 2011-06-21 DIAGNOSIS — M25676 Stiffness of unspecified foot, not elsewhere classified: Secondary | ICD-10-CM | POA: Insufficient documentation

## 2011-06-21 DIAGNOSIS — R269 Unspecified abnormalities of gait and mobility: Secondary | ICD-10-CM | POA: Insufficient documentation

## 2012-04-02 ENCOUNTER — Ambulatory Visit (INDEPENDENT_AMBULATORY_CARE_PROVIDER_SITE_OTHER): Payer: 59 | Admitting: Surgery

## 2012-04-02 ENCOUNTER — Encounter (INDEPENDENT_AMBULATORY_CARE_PROVIDER_SITE_OTHER): Payer: Self-pay | Admitting: Surgery

## 2012-04-02 VITALS — BP 102/84 | HR 78 | Temp 98.8°F | Resp 16 | Ht 71.0 in | Wt 134.6 lb

## 2012-04-02 DIAGNOSIS — L723 Sebaceous cyst: Secondary | ICD-10-CM

## 2012-04-02 DIAGNOSIS — R590 Localized enlarged lymph nodes: Secondary | ICD-10-CM

## 2012-04-02 DIAGNOSIS — R599 Enlarged lymph nodes, unspecified: Secondary | ICD-10-CM

## 2012-04-02 DIAGNOSIS — F17200 Nicotine dependence, unspecified, uncomplicated: Secondary | ICD-10-CM

## 2012-04-02 DIAGNOSIS — R221 Localized swelling, mass and lump, neck: Secondary | ICD-10-CM

## 2012-04-02 DIAGNOSIS — R220 Localized swelling, mass and lump, head: Secondary | ICD-10-CM

## 2012-04-02 DIAGNOSIS — L720 Epidermal cyst: Secondary | ICD-10-CM | POA: Insufficient documentation

## 2012-04-02 NOTE — Patient Instructions (Signed)
See the Handout(s) we gave you.  Consider surgery.  Please call our office at [phone] if you wish to schedule surgery or if you have further questions / concerns.   Cyst Removal Your caregiver has removed a cyst. A cyst is a sac containing a semi-solid material. Cysts may occur any place on your body. They may remain small for years or gradually get larger. A sebaceous cyst is an enlarged (dilated) sweat gland filled with old sweat (sebum). Unattended, these may become large (the size of a softball) over several years time. These are often removed for improved appearance (cosmetic) reasons or before they become infected to form an abscess. An abscess is an infected cyst. HOME CARE INSTRUCTIONS   Keep your bandage clean and dry. You may change your bandage after 24 hours. If your bandage sticks, use warm water to gently loosen it. Pat the area dry with a clean towel before putting on another bandage.  If possible, keep the area where the cyst was removed raised to relieve soreness, swelling, and promote healing.  If you have stitches, keep them clean and dry.  You may clean your stitches gently with a cotton swab dipped in warm soapy water.  Do not soak the area where the cyst was removed or go swimming. You may shower.  Do not overuse the area where your cyst was removed.  Return in 7 days or as directed to have your stitches removed.  Take medicines as instructed by your caregiver. SEEK IMMEDIATE MEDICAL CARE IF:   An oral temperature above 102 F (38.9 C) develops, not controlled by medication.  Blood continues to soak through the bandage.  You have increasing pain in the area where your cyst was removed.  You have redness, swelling, pus, a bad smell, soreness (inflammation), or red streaks coming away from the stitches. These are signs of infection. MAKE SURE YOU:   Understand these instructions.  Will watch your condition.  Will get help right away if you are not  doing well or get worse. Document Released: 02/12/2000 Document Revised: 05/09/2011 Document Reviewed: 06/07/2007 [facility] Patient Information 2013 [place], [place].

## 2012-04-02 NOTE — Progress Notes (Signed)
Subjective:     Patient ID: Bryan [name], male   DOB: 30-Sep-1955, 57 y.o.   MRN: [id number]  HPI  [name]  11-05-1955 [id number]  Patient Care Team: Gretel Acre, MD as PCP - General (Family Medicine)  This patient is a 57 y.o.male who presents today for surgical evaluation at the request of Dr. Ihor Dow.   Reason for evaluation: Draining Mass behind right ear.  Probable infected cyst.  Pleasant smoking male.  Has had a lump behind his right ear for 20 years.  Never really bothered him until last week.  Became painful and swollen.  It started to drain.  Saw primary care physician.  Concern of infected sebaceous cyst.  Start on doxycycline.  Sent to [place] to consider surgical removal.  Patient has had a mass behind his right neck for a while as well.  Some sensitivity when brushing up against it.  No redness or drainage there.  No fevers chills or sweats.  No night sweats.  Energy level good.  Weight stable.  Chronic smoker.  Thinking about quitting.  No problems with colds or chronic cough.  No dysphagia to solids or liquids.  No heartburn or reflux.  No history of falls or trauma.  No recent dental surgery or other abnormalities.  Patient Active Problem List  Diagnosis  . Calcaneus fracture, left  . Nicotine dependence  . Alcohol dependence  . Epidermal cyst of neck   . Mass behind right ear, probable epidermoid cyst  . Lymphadenopathy, cervical    Past Medical History  Diagnosis Date  . Joint pain     left heel  . Joint swelling     calcaneus fracture  . Curvature of spine     Past Surgical History  Procedure Date  . Foot surgery 30+yrs ago    left   . Finger surgery 30+yrs ago    right pointer finger  . Knee arthroscopy at age 10    right   . Orif calcaneous fracture 02/08/2011    Procedure: OPEN REDUCTION INTERNAL FIXATION (ORIF) CALCANEOUS FRACTURE;  Surgeon: Budd Palmer;  Location: [facility] OR;  Service: Orthopedics;  Laterality: Left;  Left Ankle.  Marland Kitchen Knuckle     . Heel spur surgery [id number]  . Knuckle surgery 1978    History   Social History  . Marital Status: Divorced    Spouse Name: N/A    Number of Children: N/A  . Years of Education: N/A   Occupational History  . Not on file.   Social History Main Topics  . Smoking status: Current Every Day Smoker -- 0.2 packs/day for 35 years    Types: Cigarettes  . Smokeless tobacco: Never Used  . Alcohol Use: Yes     Comment: 1/day  . Drug Use: No  . Sexually Active: Not Currently   Other Topics Concern  . Not on file   Social History Narrative  . No narrative on file    Family History  Problem Relation Age of Onset  . Anesthesia problems Neg Hx   . Hypotension Neg Hx   . Malignant hyperthermia Neg Hx   . Pseudochol deficiency Neg Hx     No current outpatient prescriptions on file.     No Known Allergies  BP 102/84  Pulse 78  Temp 98.8 F (37.1 C) (Temporal)  Resp 16  Ht 5\' 11"  (1.803 m)  Wt 134 lb 9.6 oz (61.054 kg)  BMI 18.77 kg/m2  No results found.  Review of Systems  Constitutional: Negative for fever, chills and diaphoresis.  HENT: Negative for hearing loss, ear pain, nosebleeds, congestion, sore throat, facial swelling, rhinorrhea, sneezing, drooling, mouth sores, trouble swallowing, neck pain, neck stiffness, dental problem, postnasal drip, sinus pressure, tinnitus and ear discharge.   Eyes: Negative for photophobia, discharge and visual disturbance.  Respiratory: Negative for cough, choking, chest tightness, shortness of breath and stridor.   Cardiovascular: Negative for chest pain and palpitations.  Gastrointestinal: Negative for nausea, vomiting, abdominal pain, diarrhea, constipation, blood in stool, abdominal distention, anal bleeding and rectal pain.  Genitourinary: Negative for dysuria, urgency, difficulty urinating and testicular pain.  Musculoskeletal: Negative for myalgias, back pain, arthralgias and gait problem.  Skin: Positive for wound.  Negative for color change, pallor and rash.  Neurological: Negative for dizziness, speech difficulty, weakness, numbness and headaches.  Hematological: Negative for adenopathy. Does not bruise/bleed easily.  Psychiatric/Behavioral: Negative for hallucinations, confusion and agitation.       Objective:   Physical Exam  Constitutional: He is oriented to person, place, and time. He appears well-developed and well-nourished. No distress.  HENT:  Head: Normocephalic. No trismus in the jaw.    Right Ear: Hearing normal. No drainage, swelling or tenderness. No decreased hearing is noted.  Left Ear: Hearing and external ear normal. No drainage, swelling or tenderness. No decreased hearing is noted.  Nose: No mucosal edema, rhinorrhea, sinus tenderness or nasal septal hematoma. No epistaxis. Right sinus exhibits no maxillary sinus tenderness and no frontal sinus tenderness. Left sinus exhibits no maxillary sinus tenderness and no frontal sinus tenderness.  Mouth/Throat: Uvula is midline, oropharynx is clear and moist and mucous membranes are normal. He does not have dentures. No oral lesions. No dental abscesses, uvula swelling, lacerations or dental caries. No oropharyngeal exudate, posterior oropharyngeal edema, posterior oropharyngeal erythema or tonsillar abscesses.  Eyes: Conjunctivae normal and EOM are normal. Pupils are equal, round, and reactive to light. No scleral icterus.  Neck: Normal range of motion. Neck supple. No tracheal deviation present.  Cardiovascular: Normal rate, regular rhythm and intact distal pulses.   Pulmonary/Chest: Effort normal and breath sounds normal. No respiratory distress.  Abdominal: Soft. He exhibits no distension. There is no tenderness. Hernia confirmed negative in the right inguinal area and confirmed negative in the left inguinal area.  Musculoskeletal: Normal range of motion. He exhibits no tenderness.  Lymphadenopathy:       Head (right side): Submandibular  adenopathy present. No preauricular and no posterior auricular adenopathy present.       Head (left side): Submandibular adenopathy present. No preauricular, no posterior auricular and no occipital adenopathy present.    He has no cervical adenopathy.    He has no axillary adenopathy.       Right: No inguinal, no supraclavicular and no epitrochlear adenopathy present.       Left: No inguinal, no supraclavicular and no epitrochlear adenopathy present.  Neurological: He is alert and oriented to person, place, and time. No cranial nerve deficit. He exhibits normal muscle tone. Coordination normal.  Skin: Skin is warm and dry. No rash noted. He is not diaphoretic. No erythema. No pallor.  Psychiatric: He has a normal mood and affect. His behavior is normal. Judgment and thought content normal.       Assessment:     Draining mass behind right ear.  Most likely consistent with infectious sebaceous cyst.  Resolved with drainage and antibiotics.  Enlarging right posterior neck mass.  Cyst vs. Lymph node.  Mild submandibular lymphadenopathy mobile and smooth.  No evidence of any mass or tumor in head or neck region    Plan:     I think the mass behind the ear and posterior neck need to be removed.  Given the sensitivity location, I recommended some sedation with local anesthetic.  The pathophysiology of skin & subcutaneous masses was discussed.  Natural history risks without surgery were discussed.  I recommended surgery to remove the mass.  I explained the technique of removal with use of local anesthesia & possible need for more aggressive sedation/anesthesia for patient comfort.    Risks such as bleeding, infection, heart attack, death, and other risks were discussed.  I noted a good likelihood this will help address the problem.   Possibility that this will not correct all symptoms was explained. Possibility of regrowth/recurrence of the mass was discussed.  We will work to minimize  complications. Questions were answered.  The patient expresses understanding & wishes to proceed with surgery.  At this point observe adjacent lymphadenopathy since it seems to be chronic and soft.  If persists after three months, consider ENT evaluation to rule out other etiologies.  Will hold off for now.  Consider stop smoking

## 2012-04-10 ENCOUNTER — Other Ambulatory Visit (INDEPENDENT_AMBULATORY_CARE_PROVIDER_SITE_OTHER): Payer: Self-pay | Admitting: Surgery

## 2012-04-10 DIAGNOSIS — E042 Nontoxic multinodular goiter: Secondary | ICD-10-CM

## 2012-04-10 HISTORY — PX: OTHER SURGICAL HISTORY: SHX169

## 2012-04-11 ENCOUNTER — Telehealth (INDEPENDENT_AMBULATORY_CARE_PROVIDER_SITE_OTHER): Payer: Self-pay

## 2012-04-11 NOTE — Telephone Encounter (Signed)
Called pt to check on him from his surgery with Dr Michaell Cowing yesterday b/c the pt had some right side numbness after surgery. The pt said he is doing fine and the numbness had gone away but he is having some swelling on the right side at the ear going down the jaw line. I spoke to Dr Michaell Cowing about the pt and he advised pt to use ice,antiinflammatories, and keep head elevated. The pt understand. Pt advised to call us if any other changes occur or he gets worse.

## 2012-04-13 ENCOUNTER — Telehealth (INDEPENDENT_AMBULATORY_CARE_PROVIDER_SITE_OTHER): Payer: Self-pay

## 2012-04-13 NOTE — Telephone Encounter (Signed)
Called pt to give him good news that his path is benign per DR Michaell Cowing. The pt understands.

## 2012-04-30 ENCOUNTER — Encounter (INDEPENDENT_AMBULATORY_CARE_PROVIDER_SITE_OTHER): Payer: Self-pay | Admitting: Surgery

## 2012-04-30 ENCOUNTER — Ambulatory Visit (INDEPENDENT_AMBULATORY_CARE_PROVIDER_SITE_OTHER): Payer: 59 | Admitting: Surgery

## 2012-04-30 VITALS — BP 124/84 | HR 78 | Resp 18 | Ht 71.0 in | Wt 133.0 lb

## 2012-04-30 DIAGNOSIS — L723 Sebaceous cyst: Secondary | ICD-10-CM

## 2012-04-30 DIAGNOSIS — L720 Epidermal cyst: Secondary | ICD-10-CM

## 2012-04-30 NOTE — Patient Instructions (Signed)
Lipoma  A lipoma is a noncancerous (benign) tumor composed of fat cells. They are usually found under the skin (subcutaneous). A lipoma may occur in any tissue of the body that contains fat. Common areas for lipomas to appear include the back, shoulders, buttocks, and thighs. Lipomas are a very common soft tissue growth. They are soft and grow slowly. Most problems caused by a lipoma depend on where it is growing.  DIAGNOSIS   A lipoma can be diagnosed with a physical exam. These tumors rarely become cancerous, but radiographic studies can help determine this for certain. Studies used may include:   Computerized X-ray scans (CT or CAT scan).   Computerized magnetic scans (MRI).  TREATMENT   Small lipomas that are not causing problems may be watched. If a lipoma continues to enlarge or causes problems, removal is often the best treatment. Lipomas can also be removed to improve appearance. Surgery is done to remove the fatty cells and the surrounding capsule. Most often, this is done with medicine that numbs the area (local anesthetic). The removed tissue is examined under a microscope to make sure it is not cancerous. Keep all follow-up appointments with your caregiver.  SEEK MEDICAL CARE IF:    The lipoma becomes larger or hard.   The lipoma becomes painful, red, or increasingly swollen. These could be signs of infection or a more serious condition.  Document Released: 02/04/2002 Document Revised: 05/09/2011 Document Reviewed: 07/17/2009  [employer] Patient Information 2013 [employer].

## 2012-04-30 NOTE — Progress Notes (Signed)
Subjective:     Patient ID: Bryan [name], male   DOB: Jun 23, 1955, 57 y.o.   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   Paresthesias on right earlobe going away.  Pulled out the suture string already.  Eating well.  Trying to avoid smoking.  Patient Active Problem List  Diagnosis  . Calcaneus fracture, left  . Nicotine dependence  . Alcohol dependence  . Lymphadenopathy, cervical    Past Medical History  Diagnosis Date  . Joint pain     left heel  . Joint swelling     calcaneus fracture  . Curvature of spine     Past Surgical History  Procedure Laterality Date  . Foot surgery  30+yrs ago    left   . Finger surgery  30+yrs ago    right pointer finger  . Knee arthroscopy  at age 1    right   . Orif calcaneous fracture  02/08/2011    Procedure: OPEN REDUCTION INTERNAL FIXATION (ORIF) CALCANEOUS FRACTURE;  Surgeon: Budd Palmer;  Location: [facility] OR;  Service: Orthopedics;  Laterality: Left;  Left Ankle.  Marland Kitchen Knuckle    . Heel spur surgery  [id number]  . Knuckle surgery  1978  . Excision of head and neck mass  04/10/12    History   Social History  . Marital Status: Divorced    Spouse Name: N/A    Number of Children: N/A  . Years of Education: N/A   Occupational History  . Not on file.   Social History Main Topics  . Smoking status: Current Every Day Smoker -- 0.25 packs/day for 35 years    Types: Cigarettes  . Smokeless tobacco: Never Used  . Alcohol Use: Yes     Comment: 1/day  . Drug Use: No  . Sexually Active: Not Currently   Other Topics Concern  . Not on file   Social History Narrative  . No narrative on file    Family History  Problem Relation Age of Onset  .  Anesthesia problems Neg Hx   . Hypotension Neg Hx   . Malignant hyperthermia Neg Hx   . Pseudochol deficiency Neg Hx     No current outpatient prescriptions on file.   No current facility-administered medications for this visit.     No Known Allergies  BP 124/84  Pulse 78  Resp 18  Ht 5\' 11"  (1.803 m)  Wt 133 lb (60.328 kg)  BMI 18.56 kg/m2  No results found.   Review of Systems  Constitutional: Negative for fever, chills and diaphoresis.  HENT: Negative for sore throat, trouble swallowing and neck pain.   Eyes: Negative for photophobia and visual disturbance.  Respiratory: Negative for choking and shortness of breath.   Cardiovascular: Negative for chest pain and palpitations.  Gastrointestinal: Negative for nausea, vomiting, abdominal distention, anal bleeding and rectal pain.  Genitourinary: Negative for dysuria, urgency, difficulty urinating and testicular pain.  Musculoskeletal: Negative for myalgias, arthralgias and gait problem.  Skin: Negative for color change and rash.  Neurological: Negative for dizziness, speech difficulty, weakness and numbness.  Hematological: Negative for adenopathy.  Psychiatric/Behavioral: Negative for hallucinations, confusion and agitation.       Objective:   Physical  Exam  Constitutional: He is oriented to person, place, and time. He appears well-developed and well-nourished. No distress.  HENT:  Head: Normocephalic.    Mouth/Throat: Oropharynx is clear and moist. No oropharyngeal exudate.  Eyes: Conjunctivae and EOM are normal. Pupils are equal, round, and reactive to light. No scleral icterus.  Neck: Normal range of motion. No tracheal deviation present.  Cardiovascular: Normal rate, normal heart sounds and intact distal pulses.   Pulmonary/Chest: Effort normal. No respiratory distress.  Abdominal: Soft. He exhibits no distension. There is no tenderness. Hernia confirmed negative in the right inguinal area and confirmed negative  in the left inguinal area.  Musculoskeletal: Normal range of motion. He exhibits no tenderness.  Neurological: He is alert and oriented to person, place, and time. No cranial nerve deficit. He exhibits normal muscle tone. Coordination normal.  Skin: Skin is warm and dry. No rash noted. He is not diaphoretic.  Psychiatric: He has a normal mood and affect. His behavior is normal.       Assessment:     S/p removal of benign skin masses     Plan:     Increase activity as tolerated to regular activity.  Do not push through pain.  Return to clinic p.r.n.   Instructions discussed.  Followup with primary care physician for other health issues as would normally be done.  Questions answered.  The patient expressed understanding and appreciation

## 2012-11-03 IMAGING — CT CT ANKLE*L* W/O CM
2 of 3 series · 7 of 14 positions shown, 9 images · non-contrast
Comparison: Left ankle radiographs performed earlier today at [DATE]
a.m.

CLINICAL DATA: Left foot pain after jumping 5 feet from machine.
Calcaneal fracture noted on radiograph.  Assess calcaneal fracture.

CT OF THE LEFT ANKLE WITHOUT CONTRAST
TECHNIQUE: Multidetector CT imaging was performed according to the
standard protocol. Multiplanar CT image reconstructions were also
generated.

[Series 2: lower ext · axial · 0.31mm/px · z∈[-229,-109]mm · 5 of 72 slices shown, 7 images]
[im 12/72  soft-tissue]
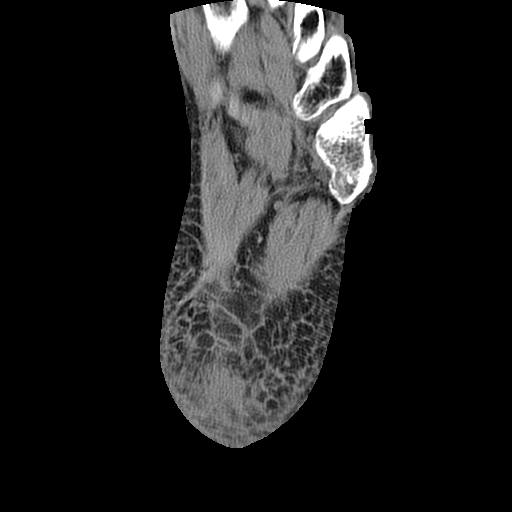
[im 12/72  bone]
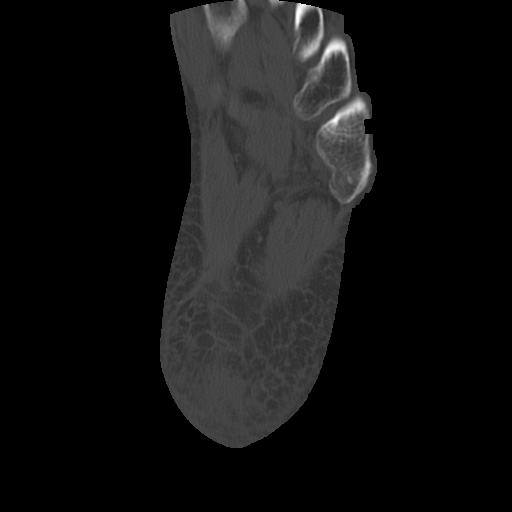
[im 24/72  bone]
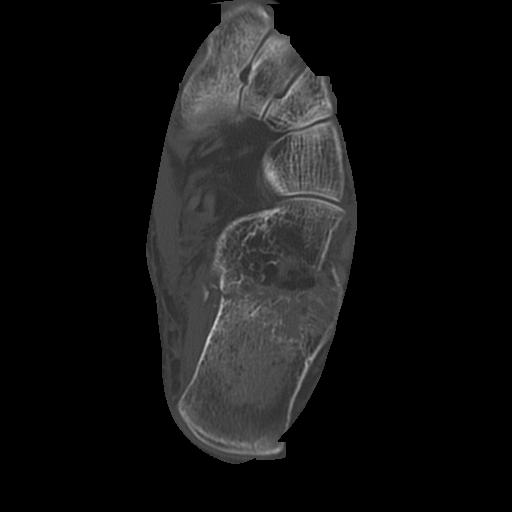
[im 36/72  bone]
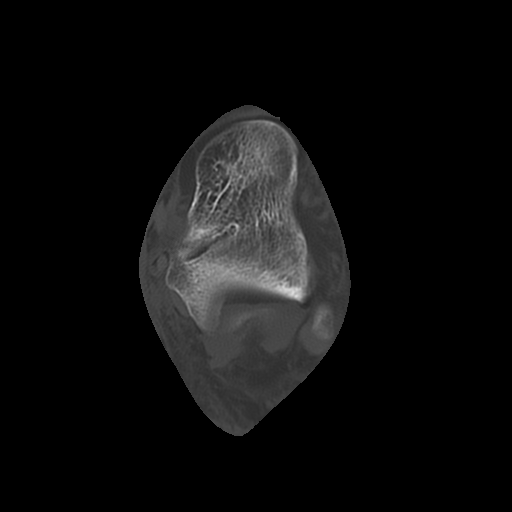
[im 48/72  bone]
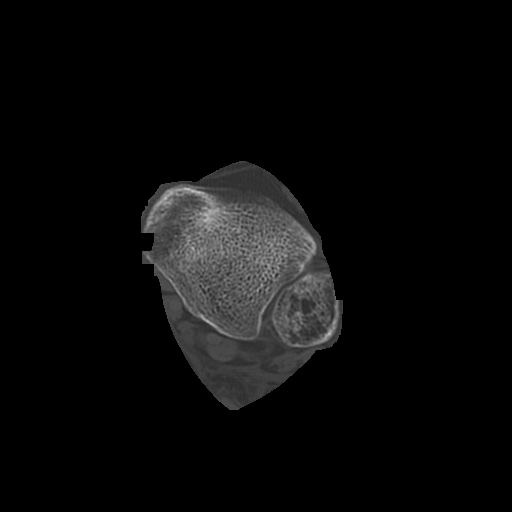
[im 60/72  soft-tissue]
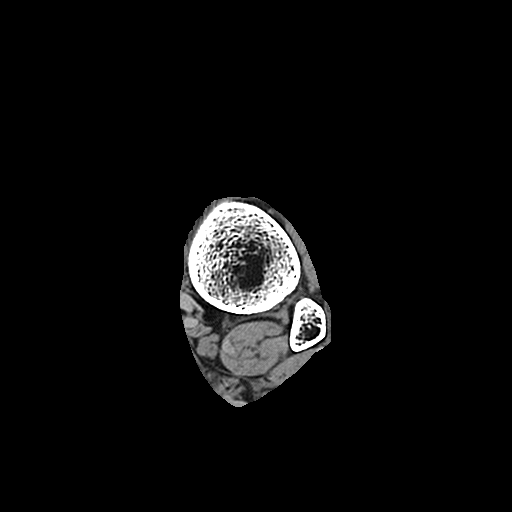
[im 60/72  bone]
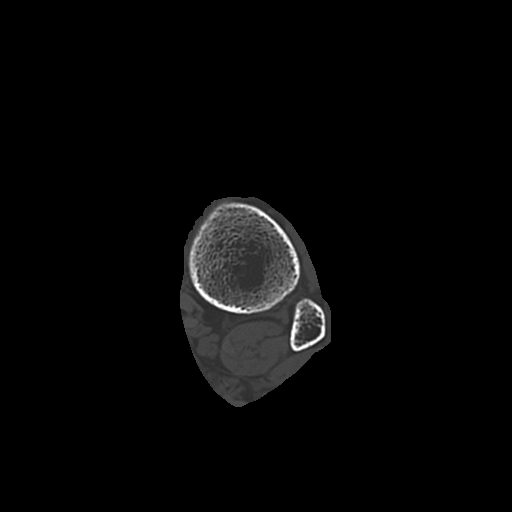

[Series 300: cor · coronal · 0.36mm/px · 2 of 40 slices shown]
[im 14/40  bone]
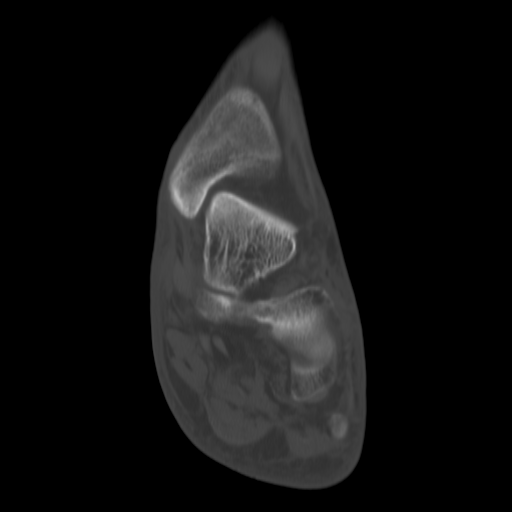
[im 27/40  bone]
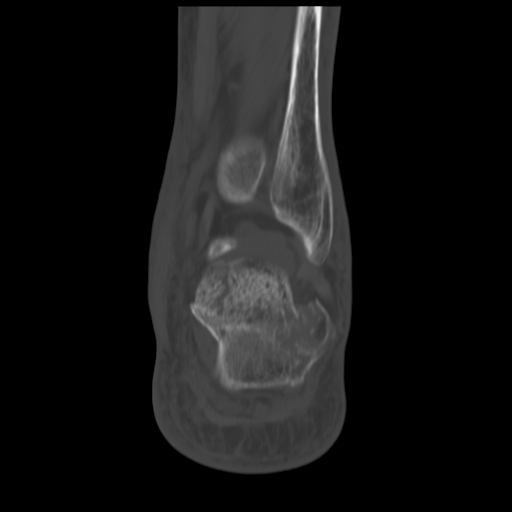

[7 of 14 positions shown; findings below may reference images not displayed]

FINDINGS: There is a comminuted fracture involving the left
calcaneus.  This reflects Lorenz Jumper type IIC fracture, with a
medial intra-articular fracture line across the posterior facet.

Additional comminution is noted, with extra-articular fracture
lines noted along the lateral aspect of the calcaneus. The medial
intra-articular fracture demonstrates mild comminution at the
medial aspect of the calcaneus.  In addition, minimally displaced
fracture lines extend anteriorly across the calcaneus; it is
difficult to determine whether one of these fracture lines extends
to the anterior facet.

This reflects a joint depression type fracture, with the fracture
line seen extending posteriorly and superiorly between the talus
and Achilles.  The posterior aspect of the calcaneus remains
intact.

A small joint effusion is noted at the ankle.  No additional
fractures are seen.  The Achilles tendon remains intact.  The
flexor and extensor tendons are unremarkable in appearance.  The
peroneus tendons appear intact.  Mild soft tissue swelling is noted
about the ankle, relatively diffuse in appearance.  There is no
evidence of entrapment of tendon structures, though the peroneus
tendons run adjacent to the comminuted calcaneal fracture.  The
vasculature is not well assessed.
IMPRESSION: 1.  Comminuted fracture involving the left calcaneus; this is a
joint depression type fracture.  With respect to the posterior
facet, this is Lorenz Jumper type IIC fracture, with a medial intra-
articular fracture line seen.  Question of minimally displaced
fracture line extending to the anterior facet.
2.  Small ankle joint effusion noted.
3.  Mild diffuse soft tissue swelling noted.
4.  The peroneus tendons run adjacent to the comminuted calcaneal
fracture; no definite evidence of entrapment at this time.

## 2016-10-26 ENCOUNTER — Ambulatory Visit (INDEPENDENT_AMBULATORY_CARE_PROVIDER_SITE_OTHER): Payer: 59 | Admitting: Neurology

## 2016-10-26 ENCOUNTER — Encounter: Payer: Self-pay | Admitting: *Deleted

## 2016-10-26 ENCOUNTER — Encounter: Payer: Self-pay | Admitting: Neurology

## 2016-10-26 ENCOUNTER — Telehealth: Payer: Self-pay | Admitting: *Deleted

## 2016-10-26 VITALS — BP 127/83 | HR 97 | Resp 16 | Ht 65.5 in | Wt 124.0 lb

## 2016-10-26 DIAGNOSIS — R29898 Other symptoms and signs involving the musculoskeletal system: Secondary | ICD-10-CM

## 2016-10-26 DIAGNOSIS — G834 Cauda equina syndrome: Secondary | ICD-10-CM | POA: Diagnosis not present

## 2016-10-26 DIAGNOSIS — R159 Full incontinence of feces: Secondary | ICD-10-CM | POA: Diagnosis not present

## 2016-10-26 NOTE — Patient Instructions (Addendum)
MRI lumbar spine: we will call you  Fall Prevention in the Home Falls can cause injuries and can affect people from all age groups. There are many simple things that you can do to make your home safe and to help prevent falls. What can I do on the outside of my home?  Regularly repair the edges of walkways and driveways and fix any cracks.  Remove high doorway thresholds.  Trim any shrubbery on the main path into your home.  Use bright outdoor lighting.  Clear walkways of debris and clutter, including tools and rocks.  Regularly check that handrails are securely fastened and in good repair. Both sides of any steps should have handrails.  Install guardrails along the edges of any raised decks or porches.  Have leaves, snow, and ice cleared regularly.  Use sand or salt on walkways during winter months.  In the garage, clean up any spills right away, including grease or oil spills. What can I do in the bathroom?  Use night lights.  Install grab bars by the toilet and in the tub and shower. Do not use towel bars as grab bars.  Use non-skid mats or decals on the floor of the tub or shower.  If you need to sit down while you are in the shower, use a plastic, non-slip stool.  Keep the floor dry. Immediately clean up any water that spills on the floor.  Remove soap buildup in the tub or shower on a regular basis.  Attach bath mats securely with double-sided non-slip rug tape.  Remove throw rugs and other tripping hazards from the floor. What can I do in the bedroom?  Use night lights.  Make sure that a bedside light is easy to reach.  Do not use oversized bedding that drapes onto the floor.  Have a firm chair that has side arms to use for getting dressed.  Remove throw rugs and other tripping hazards from the floor. What can I do in the kitchen?  Clean up any spills right away.  Avoid walking on wet floors.  Place frequently used items in easy-to-reach places.  If  you need to reach for something above you, use a sturdy step stool that has a grab bar.  Keep electrical cables out of the way.  Do not use floor polish or wax that makes floors slippery. If you have to use wax, make sure that it is non-skid floor wax.  Remove throw rugs and other tripping hazards from the floor. What can I do in the stairways?  Do not leave any items on the stairs.  Make sure that there are handrails on both sides of the stairs. Fix handrails that are broken or loose. Make sure that handrails are as long as the stairways.  Check any carpeting to make sure that it is firmly attached to the stairs. Fix any carpet that is loose or worn.  Avoid having throw rugs at the top or bottom of stairways, or secure the rugs with carpet tape to prevent them from moving.  Make sure that you have a light switch at the top of the stairs and the bottom of the stairs. If you do not have them, have them installed. What are some other fall prevention tips?  Wear closed-toe shoes that fit well and support your feet. Wear shoes that have rubber soles or low heels.  When you use a stepladder, make sure that it is completely opened and that the sides are firmly locked. Have  someone hold the ladder while you are using it. Do not climb a closed stepladder.  Add color or contrast paint or tape to grab bars and handrails in your home. Place contrasting color strips on the first and last steps.  Use mobility aids as needed, such as canes, walkers, scooters, and crutches.  Turn on lights if it is dark. Replace any light bulbs that burn out.  Set up furniture so that there are clear paths. Keep the furniture in the same spot.  Fix any uneven floor surfaces.  Choose a carpet design that does not hide the edge of steps of a stairway.  Be aware of any and all pets.  Review your medicines with your healthcare provider. Some medicines can cause dizziness or changes in blood pressure, which  increase your risk of falling. Talk with your health care provider about other ways that you can decrease your risk of falls. This may include working with a physical therapist or trainer to improve your strength, balance, and endurance. This information is not intended to replace advice given to you by your health care provider. Make sure you discuss any questions you have with your health care provider. Document Released: 02/04/2002 Document Revised: 07/14/2015 Document Reviewed: 03/21/2014 Elsevier Interactive Patient Education  2017 [employer]

## 2016-10-26 NOTE — Telephone Encounter (Signed)
Faxed letter given to pt today in office to [facility] as requested by patient. Signed release also sent. Fax: [phone]. Received confirmation.
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
## 2016-10-29 ENCOUNTER — Ambulatory Visit
Admission: RE | Admit: 2016-10-29 | Discharge: 2016-10-29 | Disposition: A | Discharge status: Home / Self Care | Payer: Worker's Compensation | Source: Ambulatory Visit | Attending: Neurology | Admitting: Neurology

## 2016-10-29 DIAGNOSIS — R29898 Other symptoms and signs involving the musculoskeletal system: Secondary | ICD-10-CM

## 2016-10-29 DIAGNOSIS — G834 Cauda equina syndrome: Secondary | ICD-10-CM

## 2016-10-29 DIAGNOSIS — R159 Full incontinence of feces: Secondary | ICD-10-CM

## 2016-11-02 ENCOUNTER — Telehealth: Payer: Self-pay

## 2016-11-02 DIAGNOSIS — R29898 Other symptoms and signs involving the musculoskeletal system: Secondary | ICD-10-CM

## 2016-11-02 NOTE — Telephone Encounter (Signed)
-----   Message from Anson FretAntonia B Ahern, MD sent at 10/29/2016 12:18 PM EDT ----- Nothing recent to explain his new onset leg weakness and changes in bowel. At this point we would probably have to MRI his brain, cervical spine and thoracic spine to make sure no etiology in any part of the brain or spine. Let me know if he is willing and I can order. He should also have an emg/ncs if nothing found on these images and he continues to worsen. Thanks

## 2016-11-02 NOTE — Telephone Encounter (Signed)
I called pt, left a detailed message on his home phone number, per DPR, advising him that there is nothing recent to explain his new onset leg weakness and changes in bowel. Dr. Lucia GaskinsAhern recommends considering an MRI brain, cervical spine, and thoracic spine to make sure no etiology in any part of the brain or spine. There also can be an emg/ncv if pt's symptoms continue to worsen. I asked pt to call us back if he is willing to undergo the other MRIs. I left my name and clinic number.

## 2016-11-03 NOTE — Telephone Encounter (Signed)
I called pt again to discuss his MRI results and to see if he had any questions from the MRI results that I left for him yesterday on his home phone number. No answer, left a message asking him to call me back.

## 2016-11-07 NOTE — Telephone Encounter (Signed)
Pt returned my call. He is agreeable to proceeding with the MRI brain, cervical spine, and thoracic spine, with possible doing an NCV/EMG if his symptoms worsen. Pt is still experiencing leg weakness. Pt knows that our office will work on getting his MRIs approved and will call him to schedule. Pt verbalized understanding of results. Pt had no questions at this time but was encouraged to call back if questions arise.  I spoke with Dr. Lucia GaskinsAhern and confirmed that she needs the pt to undergo MRI brain, c-spine, and thoracic spine WO contrast. Orders placed.

## 2016-11-07 NOTE — Telephone Encounter (Signed)
Pt returned RN's call. Call was transferred to RN

## 2016-11-10 NOTE — Telephone Encounter (Signed)
I called patient back and I informed him [facility] he would call them to reschedule. And I gave him their phone number of [phone]

## 2016-11-10 NOTE — Telephone Encounter (Signed)
Pt wanting to r/s his appointments for tomorrow.  [facility] not [facility], he is asking for a call back as to what to do to r/s the MRI's please call

## 2016-11-11 ENCOUNTER — Other Ambulatory Visit: Payer: Self-pay

## 2016-11-23 ENCOUNTER — Ambulatory Visit
Admission: RE | Admit: 2016-11-23 | Discharge: 2016-11-23 | Disposition: A | Discharge status: Home / Self Care | Payer: 59 | Source: Ambulatory Visit | Attending: Neurology | Admitting: Neurology

## 2016-11-23 ENCOUNTER — Telehealth: Payer: Self-pay | Admitting: *Deleted

## 2016-11-23 DIAGNOSIS — R29898 Other symptoms and signs involving the musculoskeletal system: Secondary | ICD-10-CM

## 2016-11-23 NOTE — Telephone Encounter (Signed)
Pt's brother called said the pt dropped limited time wage form a couple of weeks ago. He said the pt is to retire on Monday and if this is not faxed back today or tomorrow he will be terminated. Please call asap him at [phone]

## 2016-11-23 NOTE — Telephone Encounter (Signed)
Spoke with Candi Leash. She will call brother back. She stated she already spoke with patient today.

## 2016-11-23 NOTE — Telephone Encounter (Signed)
I spoke to the pt, and he is going to bring a copy of the form by the office.

## 2016-11-24 NOTE — Telephone Encounter (Signed)
Pt is here now waiting for form to be filled out.  Limited wage continuation form.

## 2016-11-24 NOTE — Telephone Encounter (Signed)
I filled out after speaking to son, and pt and Dawn at [employer].  Dr. Lucia Gaskins signed.  Completed form faxed with confirmation.  Son and pt have copy for themselves and original to give to [employer], Dawn.  Pt is about to retire from [employer].  This is needed from work for pt.

## 2016-11-28 ENCOUNTER — Other Ambulatory Visit: Payer: Self-pay | Admitting: Neurology

## 2016-11-28 ENCOUNTER — Telehealth: Payer: Self-pay

## 2016-11-28 DIAGNOSIS — R29898 Other symptoms and signs involving the musculoskeletal system: Secondary | ICD-10-CM

## 2016-11-28 NOTE — Telephone Encounter (Signed)
-----   Message from Anson Fret, MD sent at 11/27/2016  9:50 AM EDT ----- MRI of the brain does not show any strokes or lesions or anything to explain symptoms, it does show signs of normal aging.  No acute findings.

## 2016-11-28 NOTE — Telephone Encounter (Signed)
Let patient know the next thing we should do is an emg/ncs, I will order it and we can schedule it before November. This test will evaluate his muscles to make sure he doesn;t have a muscle disease or problems with his nerve causing the leg symptoms. thanks

## 2016-11-28 NOTE — Telephone Encounter (Signed)
I called pt to discuss his MRI results. No answer, left a message asking him to call me back. 

## 2016-11-28 NOTE — Telephone Encounter (Signed)
Pt has returned the call and is asking for a call back please

## 2016-11-28 NOTE — Telephone Encounter (Signed)
MRI thoracic spine: "Patient has known scoliosis and old compression fractures but nothing new to explain his symptoms thanks" per Dr. Lucia Gaskins.

## 2016-11-28 NOTE — Telephone Encounter (Signed)
I called pt, advised him of his MRI brain results. Pt says that he wants to know what Dr. Trevor Mace recommendations are now? Pt is still having weakness in his legs. Pt has an appt scheduled for February, and the next available office visit is not until 01/16/17 with Dr. Lucia Gaskins. Pt would like something to be done before November 19th.   Can pt be seen by the NP? Is there anything else that you recommend for this pt?

## 2016-11-28 NOTE — Telephone Encounter (Signed)
MRI cervical spine: "Patient has arthritic/degenerative changes, cord is normal, but nothing new to explain his recent symptoms thanks". Per Dr. Lucia Gaskins.

## 2016-11-28 NOTE — Telephone Encounter (Signed)
I called pt to discuss. No answer, left a message asking him to call me back. 

## 2016-11-30 NOTE — Telephone Encounter (Signed)
I called pt again to discuss. No answer, left a message asking him to call me back. 

## 2016-12-01 ENCOUNTER — Telehealth: Payer: Self-pay | Admitting: Neurology

## 2016-12-01 NOTE — Telephone Encounter (Signed)
I called pt. I explained that his MRI thoracic spine showed the known scoliosis and old compression fractures but nothing new to explain his symptoms. I also advised pt that his cervical spine MRI showed arthritic/degenerative changes, the cord is normal, but again, nothing new to explain his recent symptoms. I advised him that Dr. Lucia Gaskins recommends that pt proceed with a NCV/EMG to evaluate his muscles and nerves in his legs. Dr. Lucia Gaskins wants this scheduled before November. Pt is agreeable to this. Pt verbalized understanding of results. Pt had no questions at this time but was encouraged to call back if questions arise.

## 2016-12-01 NOTE — Telephone Encounter (Signed)
Spoke to pt over phone, NCV/EMG has been scheduled!

## 2016-12-15 ENCOUNTER — Ambulatory Visit (INDEPENDENT_AMBULATORY_CARE_PROVIDER_SITE_OTHER): Payer: 59 | Admitting: Neurology

## 2016-12-15 ENCOUNTER — Ambulatory Visit (INDEPENDENT_AMBULATORY_CARE_PROVIDER_SITE_OTHER): Payer: Self-pay | Admitting: Neurology

## 2016-12-15 DIAGNOSIS — G629 Polyneuropathy, unspecified: Secondary | ICD-10-CM

## 2016-12-15 DIAGNOSIS — R29898 Other symptoms and signs involving the musculoskeletal system: Secondary | ICD-10-CM

## 2016-12-15 DIAGNOSIS — Z0289 Encounter for other administrative examinations: Secondary | ICD-10-CM

## 2016-12-16 NOTE — Progress Notes (Signed)
Full Name: Bryan [name] Gender: Male MRN #: [id number] Date of Birth: Sep 25, 2055    Visit Date: 12/15/16 10:51 Age: 61 Years 1 Months Old Examining Physician: Naomie Dean, MD     History: Patient with bilateral leg pain and weakness  Summary: The right peroneal motor nerve showed decreased amplitude (1.2 mV, N>2). The left peroneal motor nerve showed delayed distal onset latency (8.1 ms, N<6.5) and decreased amplitude (1.5 mV,N> 2). The right sural sensory nerve, left sural sensory nerve, right superficial peroneal sensory nerve and left superficial peroneal sensory nerve showed no response. The right tibial F-wave showed delayed latency (58.4 ms, N<56). The left tibial F-wave showed delayed latency (60.5 ms, N<56). All remaining nerves were within normal limits. All muscles were within normal limits.   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 No evidence for myopathy/myositis, neuromuscular disease, lumbar radiculopathy.  Cc: Dr. Wilfrid Lund M.D.  [facility] [address] [place], [place] [address] Tel: [phone] Fax: [phone]        [facility]    Nerve / Sites Muscle Latency Ref. Amplitude Ref. Rel Amp Segments Distance Velocity Ref. Area    ms ms mV mV %  cm m/s m/s mVms  R Peroneal - EDB     Ankle EDB 6.5 ?6.5 1.2 ?2.0 100 Ankle - EDB 9   3.0     Fib head EDB 15.7  1.0  81.5 Fib head - Ankle 34 37 ?44 2.8     Pop fossa EDB 18.3  1.0  108 Pop fossa - Fib head 10 39 ?44 4.1         Pop fossa - Ankle      L Peroneal - EDB     Ankle EDB 8.1 ?6.5 1.5 ?2.0 100 Ankle - EDB 9   5.4     Fib head EDB 16.6  1.3  90.9 Fib head - Ankle 34 40 ?44 4.8     Pop fossa EDB 19.0  1.3  101 Pop fossa - Fib head 10 41 ?44 4.8         Pop fossa - Ankle      R Tibial - AH     Ankle AH 5.7 ?5.8 10.3 ?4.0 100 Ankle - AH 9   35.4     Pop fossa AH 15.7  8.3  81 Pop fossa - Ankle 40 40 ?41 32.8  L Tibial - AH     Ankle AH  5.8 ?5.8 11.3 ?4.0 100 Ankle - AH 9   37.5     Pop fossa AH 15.8  7.1  62.8 Pop fossa - Ankle 40 40 ?41 34.8             [facility]NC    Nerve / Sites Rec. Site Peak Lat Ref.  Amp Ref. Segments Distance    ms ms V V  cm  R Sural - Ankle (Calf)     Calf Ankle NR ?4.4 NR ?6 Calf - Ankle 14  L Sural - Ankle (Calf)     Calf Ankle NR ?4.4 NR ?6 Calf - Ankle 14  R Superficial peroneal - Ankle     Lat leg Ankle NR ?4.4 NR ?6 Lat leg - Ankle 14  L Superficial peroneal - Ankle     Lat leg Ankle NR ?4.4 NR ?6 Lat leg - Ankle 14              F  Wave    Nerve F Lat Ref.   ms ms  R Tibial - AH 58.4 ?56.0  L Tibial - AH 60.5 ?56.0         EMG full       EMG       EMG Summary Table    Spontaneous MUAP Recruitment  Muscle IA Fib PSW Fasc Other Amp Dur. Poly Pattern  R. Vastus medialis Normal None None None _______ Normal Normal Normal Normal  R. Abductor hallucis Normal None None None _______ Normal Normal Normal Normal  R. Extensor hallucis longus Normal None None None _______ Normal Normal Normal Normal  R. Iliopsoas Normal None None None _______ Normal Normal Normal Normal  R. Tibialis anterior Normal None None None _______ Normal Normal Normal Normal  R. Gastrocnemius (Medial head) Normal None None None _______ Normal Normal Normal Normal  R. Biceps femoris (long head) Normal None None None _______ Normal Normal Normal Normal  R. Gluteus maximus Normal None None None _______ Normal Normal Normal Normal  R. Lumbar paraspinals (low) Normal None None None _______ Normal Normal Normal Normal

## 2016-12-16 NOTE — Progress Notes (Signed)
See procedure note.

## 2016-12-20 NOTE — Procedures (Signed)
      Full Name: Bryan [name] Gender: Male MRN #: [id number] Date of Birth: 04/09/1955    Visit Date: 12/15/16 10:51 Age: 61 Years 1 Months Old Examining Physician: Antonia Ahern, MD     History: Patient with bilateral leg pain and weakness  Summary: The right peroneal motor nerve showed decreased amplitude (1.2 mV, N>2). The left peroneal motor nerve showed delayed distal onset latency (8.1 ms, N<6.5) and decreased amplitude (1.5 mV,N> 2). The right sural sensory nerve, left sural sensory nerve, right superficial peroneal sensory nerve and left superficial peroneal sensory nerve showed no response. The right tibial F-wave showed delayed latency (58.4 ms, N<56). The left tibial F-wave showed delayed latency (60.5 ms, N<56). All remaining nerves were within normal limits. All muscles were within normal limits.   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 No evidence for myopathy/myositis, neuromuscular disease, lumbar radiculopathy.  Cc: Dr. Fried   Antonia Ahern M.D.  [facility] [address] , [place] [address] Tel: [phone] Fax: [phone]        [facility]    Nerve / Sites Muscle Latency Ref. Amplitude Ref. Rel Amp Segments Distance Velocity Ref. Area    ms ms mV mV %  cm m/s m/s mVms  R Peroneal - EDB     Ankle EDB 6.5 ?6.5 1.2 ?2.0 100 Ankle - EDB 9   3.0     Fib head EDB 15.7  1.0  81.5 Fib head - Ankle 34 37 ?44 2.8     Pop fossa EDB 18.3  1.0  108 Pop fossa - Fib head 10 39 ?44 4.1         Pop fossa - Ankle      L Peroneal - EDB     Ankle EDB 8.1 ?6.5 1.5 ?2.0 100 Ankle - EDB 9   5.4     Fib head EDB 16.6  1.3  90.9 Fib head - Ankle 34 40 ?44 4.8     Pop fossa EDB 19.0  1.3  101 Pop fossa - Fib head 10 41 ?44 4.8         Pop fossa - Ankle      R Tibial - AH     Ankle AH 5.7 ?5.8 10.3 ?4.0 100 Ankle - AH 9   35.4     Pop fossa AH 15.7  8.3  81 Pop fossa - Ankle 40 40 ?41 32.8  L Tibial - AH     Ankle AH  5.8 ?5.8 11.3 ?4.0 100 Ankle - AH 9   37.5     Pop fossa AH 15.8  7.1  62.8 Pop fossa - Ankle 40 40 ?41 34.8             SNC    Nerve / Sites Rec. Site Peak Lat Ref.  Amp Ref. Segments Distance    ms ms V V  cm  R Sural - Ankle (Calf)     Calf Ankle NR ?4.4 NR ?6 Calf - Ankle 14  L Sural - Ankle (Calf)     Calf Ankle NR ?4.4 NR ?6 Calf - Ankle 14  R Superficial peroneal - Ankle     Lat leg Ankle NR ?4.4 NR ?6 Lat leg - Ankle 14  L Superficial peroneal - Ankle     Lat leg Ankle NR ?4.4 NR ?6 Lat leg - Ankle 14              F    Wave    Nerve F Lat Ref.   ms ms  R Tibial - AH 58.4 ?56.0  L Tibial - AH 60.5 ?56.0         EMG full       EMG       EMG Summary Table    Spontaneous MUAP Recruitment  Muscle IA Fib PSW Fasc Other Amp Dur. Poly Pattern  R. Vastus medialis Normal None None None _______ Normal Normal Normal Normal  R. Abductor hallucis Normal None None None _______ Normal Normal Normal Normal  R. Extensor hallucis longus Normal None None None _______ Normal Normal Normal Normal  R. Iliopsoas Normal None None None _______ Normal Normal Normal Normal  R. Tibialis anterior Normal None None None _______ Normal Normal Normal Normal  R. Gastrocnemius (Medial head) Normal None None None _______ Normal Normal Normal Normal  R. Biceps femoris (long head) Normal None None None _______ Normal Normal Normal Normal  R. Gluteus maximus Normal None None None _______ Normal Normal Normal Normal  R. Lumbar paraspinals (low) Normal None None None _______ Normal Normal Normal Normal      

## 2017-04-27 ENCOUNTER — Ambulatory Visit: Payer: 59 | Admitting: Neurology

## 2017-04-27 ENCOUNTER — Telehealth: Payer: Self-pay | Admitting: *Deleted

## 2017-04-27 NOTE — Telephone Encounter (Signed)
Pt no showed f/u appt on 04/27/2017 @ 08:30.

## 2017-05-01 ENCOUNTER — Encounter: Payer: Self-pay | Admitting: Neurology

## 2017-08-28 DEATH — deceased

## 2017-10-19 ENCOUNTER — Telehealth: Payer: Self-pay | Admitting: Neurology

## 2017-10-19 NOTE — Telephone Encounter (Signed)
Angela/[facility] billing [phone] called reg PA for MRI spine DOS 10/29/16. Please call to discuss

## 2017-10-23 NOTE — Telephone Encounter (Signed)
Spoke to [place] at [facility] billing department and informed her that the MRI's did not require and auth through [facility]. She stated they got something back about getting auth for [employer] worker's comp. I informed Marylene Landngela that we do not do anything about workers comp..Marland Kitchen
